# Patient Record
Sex: Male | Born: 1995 | Race: White | Hispanic: No | Marital: Married | Smoking: Former smoker
Health system: Southern US, Community
[De-identification: ages and names within clinical notes are randomized; demographics above are authoritative.]

## PROBLEM LIST (undated history)

## (undated) ENCOUNTER — Ambulatory Visit: Source: Home / Self Care

## (undated) DIAGNOSIS — Z8614 Personal history of Methicillin resistant Staphylococcus aureus infection: Secondary | ICD-10-CM

## (undated) DIAGNOSIS — F419 Anxiety disorder, unspecified: Secondary | ICD-10-CM

## (undated) DIAGNOSIS — T4145XA Adverse effect of unspecified anesthetic, initial encounter: Secondary | ICD-10-CM

## (undated) DIAGNOSIS — Z789 Other specified health status: Secondary | ICD-10-CM

## (undated) HISTORY — DX: Personal history of Methicillin resistant Staphylococcus aureus infection: Z86.14

---

## 2014-02-28 ENCOUNTER — Encounter (HOSPITAL_COMMUNITY): Payer: Self-pay | Admitting: Emergency Medicine

## 2014-02-28 ENCOUNTER — Emergency Department (INDEPENDENT_AMBULATORY_CARE_PROVIDER_SITE_OTHER)
Admission: EM | Admit: 2014-02-28 | Discharge: 2014-02-28 | Disposition: A | Discharge status: Home / Self Care | Payer: BC Managed Care – PPO | Source: Home / Self Care | Attending: Family Medicine | Admitting: Family Medicine

## 2014-02-28 DIAGNOSIS — S0003XA Contusion of scalp, initial encounter: Secondary | ICD-10-CM

## 2014-02-28 DIAGNOSIS — IMO0002 Reserved for concepts with insufficient information to code with codable children: Secondary | ICD-10-CM

## 2014-02-28 DIAGNOSIS — S1093XA Contusion of unspecified part of neck, initial encounter: Secondary | ICD-10-CM

## 2014-02-28 DIAGNOSIS — Y93H2 Activity, gardening and landscaping: Secondary | ICD-10-CM

## 2014-02-28 DIAGNOSIS — S0083XA Contusion of other part of head, initial encounter: Secondary | ICD-10-CM

## 2014-02-28 MED ORDER — CEPHALEXIN 500 MG PO CAPS: 500.0000 mg | ORAL_CAPSULE | Freq: Four times a day (QID) | ORAL | Status: DC
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
## 2014-02-28 NOTE — Discharge Instructions (Signed)
Please apply warm compresses to affected area 4 x day and take medication as prescribed. It appears that you have a small collection of blood under the skin and the overlying skin has developed a small infection. If area does not improve after completing course of cephalexin, please return for re-evaluation.  Hematoma A hematoma is a collection of blood under the skin, in an organ, in a body space, in a joint space, or in other tissue. The blood can clot to form a lump that you can see and feel. The lump is often firm and may sometimes become sore and tender. Most hematomas get better in a few days to weeks. However, some hematomas may be serious and require medical care. Hematomas can range in size from very small to very large. CAUSES  A hematoma can be caused by a blunt or penetrating injury. It can also be caused by spontaneous leakage from a blood vessel under the skin. Spontaneous leakage from a blood vessel is more likely to occur in older people, especially those taking blood thinners. Sometimes, a hematoma can develop after certain medical procedures. SIGNS AND SYMPTOMS   A firm lump on the body.  Possible pain and tenderness in the area.  Bruising.Blue, dark blue, purple-red, or yellowish skin may appear at the site of the hematoma if the hematoma is close to the surface of the skin. For hematomas in deeper tissues or body spaces, the signs and symptoms may be subtle. For example, an intra-abdominal hematoma may cause abdominal pain, weakness, fainting, and shortness of breath. An intracranial hematoma may cause a headache or symptoms such as weakness, trouble speaking, or a change in consciousness. DIAGNOSIS  A hematoma can usually be diagnosed based on your medical history and a physical exam. Imaging tests may be needed if your health care provider suspects a hematoma in deeper tissues or body spaces, such as the abdomen, head, or chest. These tests may include ultrasonography or a CT  scan.  TREATMENT  Hematomas usually go away on their own over time. Rarely does the blood need to be drained out of the body. Large hematomas or those that may affect vital organs will sometimes need surgical drainage or monitoring. HOME CARE INSTRUCTIONS   Apply ice to the injured area:   Put ice in a plastic bag.   Place a towel between your skin and the bag.   Leave the ice on for 20 minutes, 2 3 times a day for the first 1 to 2 days.   After the first 2 days, switch to using warm compresses on the hematoma.   Elevate the injured area to help decrease pain and swelling. Wrapping the area with an elastic bandage may also be helpful. Compression helps to reduce swelling and promotes shrinking of the hematoma. Make sure the bandage is not wrapped too tight.   If your hematoma is on a lower extremity and is painful, crutches may be helpful for a couple days.   Only take over-the-counter or prescription medicines as directed by your health care provider. SEEK IMMEDIATE MEDICAL CARE IF:   You have increasing pain, or your pain is not controlled with medicine.   You have a fever.   You have worsening swelling or discoloration.   Your skin over the hematoma breaks or starts bleeding.   Your hematoma is in your chest or abdomen and you have weakness, shortness of breath, or a change in consciousness.  Your hematoma is on your scalp (caused by a  fall or injury) and you have a worsening headache or a change in alertness or consciousness. MAKE SURE YOU:   Understand these instructions.  Will watch your condition.  Will get help right away if you are not doing well or get worse. Document Released: 05/11/2004 Document Revised: 05/30/2013 Document Reviewed: 03/07/2013 [facility] Patient Information 2014 [place].
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
## 2014-03-04 NOTE — ED Provider Notes (Signed)
Medical screening examination/treatment/procedure(s) were performed by a resident physician or non-physician practitioner and as the supervising physician I was immediately available for consultation/collaboration.  Amoni Scallan, MD    Anikka Marsan S Classie Weng, MD 03/04/14 0852 

## 2014-07-16 ENCOUNTER — Encounter (HOSPITAL_COMMUNITY): Payer: Self-pay | Admitting: Emergency Medicine

## 2014-07-16 ENCOUNTER — Emergency Department (HOSPITAL_COMMUNITY)
Admission: EM | Admit: 2014-07-16 | Discharge: 2014-07-16 | Disposition: A | Discharge status: Home / Self Care | Payer: BC Managed Care – PPO | Attending: Emergency Medicine | Admitting: Emergency Medicine

## 2014-07-16 DIAGNOSIS — L739 Follicular disorder, unspecified: Secondary | ICD-10-CM | POA: Insufficient documentation

## 2014-07-16 DIAGNOSIS — Z880 Allergy status to penicillin: Secondary | ICD-10-CM | POA: Insufficient documentation

## 2014-07-16 DIAGNOSIS — M7989 Other specified soft tissue disorders: Secondary | ICD-10-CM | POA: Diagnosis present

## 2014-07-16 DIAGNOSIS — Z720 Tobacco use: Secondary | ICD-10-CM | POA: Diagnosis not present

## 2014-07-16 DIAGNOSIS — Z792 Long term (current) use of antibiotics: Secondary | ICD-10-CM | POA: Diagnosis not present

## 2014-07-16 NOTE — Discharge Instructions (Signed)
Return to the emergency room with worsening of symptoms, new symptoms or with symptoms that are concerning, especially fevers, nausea, vomiting, increased redness, swelling, warmth and pain.  Applying warm compresses to the affected areas. Please call your doctor for a followup appointment within 24-48 hours. When you talk to your doctor please let them know that you were seen in the emergency department and have them acquire all of your records so that they can discuss the findings with you and formulate a treatment plan to fully care for your new and ongoing problems. If you do not have a primary care provider please call the number below under ED resources to establish care with a provider and follow up.    Emergency Department Resource Guide 1) Find a Doctor and Pay Out of Pocket Although you won't have to find out who is covered by your insurance plan, it is a good idea to ask around and get recommendations. You will then need to call the office and see if the doctor you have chosen will accept you as a new patient and what types of options they offer for patients who are self-pay. Some doctors offer discounts or will set up payment plans for their patients who do not have insurance, but you will need to ask so you aren't surprised when you get to your appointment.  2) Contact Your Local Health Department Not all health departments have doctors that can see patients for sick visits, but many do, so it is worth a call to see if yours does. If you don't know where your local health department is, you can check in your phone book. The CDC also has a tool to help you locate your state's health department, and many state websites also have listings of all of their local health departments.  3) Find a Walk-in Clinic If your illness is not likely to be very severe or complicated, you may want to try a walk in clinic. These are popping up all over the country in pharmacies, drugstores, and shopping  centers. They're usually staffed by nurse practitioners or physician assistants that have been trained to treat common illnesses and complaints. They're usually fairly quick and inexpensive. However, if you have serious medical issues or chronic medical problems, these are probably not your best option.  No Primary Care Doctor: - Call Health Connect at  [phone] - they can help you locate a primary care doctor that  accepts your insurance, provides certain services, etc. - Physician Referral Service- [phone]  Chronic Pain Problems: Organization         Address  Phone   Notes  [facility]  [phone] Patients need to be referred by their primary care doctor.   Medication Assistance: Organization         Address  Phone   Notes  [facility] Medication [facility] [address] [place] [place], [place] [address] [phone] --Must be a resident of [facility] -- Must have NO insurance coverage whatsoever (no Medicaid/ Medicare, etc.) -- The pt. MUST have a primary care doctor that directs their care regularly and follows them in the community   MedAssist  [phone]   [employer]  [phone]    Agencies that provide inexpensive medical care: Organization         Address  Phone   Notes  [facility]  [phone]   [facility]    [phone]  [facility] [place], [place] [address] [phone]   [place]. [address], [place] [phone]   Planned Parenthood    [phone]   [facility]    [phone]   [address], [place] Phone:  [phone], Fax:  [phone] Hours of Operation:  9 am - 6 pm, M-F.  Also accepts Medicaid/Medicare and self-pay.  [facility] [place], [address], [place] Phone: [phone], Fax: [phone]. Hours of Operation:  8:30 am - 5:30 pm, M-F.  Also accepts Medicaid and self-pay.  [facility] [place] [address] [place] Phone: [phone]   [place], [place], [place] [phone], Ext. [phone] Mondays & Thursdays: 7-9 AM.  First 15 patients are seen on a first come, first serve basis.    [place] Providers:  Organization         Address  Phone   Notes  [facility] [address], [place] [phone] Also accepts self-pay patients.  [facility] [address], [employer]  [phone]   [place], [address], [place] [phone]   [facility] [address] [place] [phone]   Lucianne Lei [address], [place]   [phone] Only accepts [place] Access [place] patients after they have their name applied to their card.   Self-Pay (no insurance) in [facility]:  Organization         Address  Phone   Notes  Sickle Cell Patients, [facility] Internal Medicine [place] [phone]   [facility] [place] [phone]   [facility] [place]  [place], [place], [place] [phone]   [facility]/Dr. Osei-Bonsu  [address] [place] or [address], [place] [phone] Phone number for both [place] and [place] locations is the same.  Urgent Medical and [facility] [address] [place] [phone]   [facility] [address] [place] or [address] [phone]   [facility] [address] [place] [phone], phone; [phone], fax Sees patients 1st and 3rd Saturday of every month.  Must not qualify for public or private insurance (i.e.  Medicaid, Medicare, [place] Health Choice, Veterans' Benefits)  Household income should be no more than 200% of the poverty level The clinic cannot treat you if you are pregnant or think you are pregnant  Sexually transmitted diseases are not treated at the clinic.    Dental Care: Organization         Address  Phone  Notes  [facility] Department of [facility] [place] [phone] Accepts children up to age 21 who are enrolled in [place] or [place]; pregnant women with a Medicaid card; and children who have applied for Medicaid or [place] Health Choice, but were declined, whose parents can pay a reduced fee at time of service.  [facility] Department of [facility]  [address], [place] [phone] Accepts children up to age 70 who  are enrolled in Medicaid or [place] Health Choice; pregnant women with a Medicaid card; and children who have applied for Medicaid or [place] Health Choice, but were declined, whose parents can pay a reduced fee at time of service.  [place] Adult Dental Access PROGRAM  [place] [phone] Patients are seen by appointment only. Walk-ins are not accepted. [place] will see patients 59 years of age and older. Monday - Tuesday (8am-5pm) Most Wednesdays (8:30-5pm) $30 per visit, cash only  [facility] Adult Dental Access PROGRAM  [address], [facility] [phone] Patients are seen by appointment only. Walk-ins are not accepted. [place] will see patients 57 years of age and older. One Wednesday Evening (Monthly: Volunteer Based).  $30 per visit, cash only  [place]  [phone] for adults; Children under age 44, call Graduate Pediatric Dentistry at [phone]. Children aged 28-14, please call [phone] to request a pediatric application.  Dental services are provided in all areas of dental care including fillings,  crowns and bridges, complete and partial dentures, implants, gum treatment, root canals, and extractions. Preventive care is also provided. Treatment is provided to both adults and children. Patients are selected via a lottery and there is often a waiting list.   [facility] [address] [place]  [phone] [email or web address]   [facility] [address] [place], [place] [phone], Ext. [phone] Second and Fourth Thursday of each month, opens at 6:30 AM; Clinic ends at 9 AM.  Patients are seen on a first-come first-served basis, and a limited number are seen during each clinic.   [facility]  [address] Hillard Danker [place], [place] [phone]   Eligibility Requirements You must have lived in [place], [place], or [place] counties for at least the last three months.   You cannot be eligible for state or federal sponsored [employer], including [employer], [place], or [employer].   You generally cannot be eligible for healthcare insurance through your employer.    How to apply: Eligibility screenings are held every Tuesday and Wednesday afternoon from 1:00 pm until 4:00 pm. You do not need an appointment for the interview!  [facility] [address] [place], [place]   [place]  [place] Department  [place]  [phone]    Behavioral Health Resources in the Community: Intensive Outpatient Programs Organization         Address  Phone  Notes  [place]. [address] [place], [place] [phone]   [facility] Outpatient [address] [place], [place]   ADS: Alcohol & Drug Svcs [address] [place], [place]   [place] [address],  [employer], [place] or [phone]   Substance Abuse  Resources Organization         Address  Phone  Notes  Alcohol and Drug Services  [phone]   [place]  [phone]   The [place]   Chinita Pester  [phone]   Residential & Outpatient Substance Abuse Program  [phone]   Psychological Services Organization         Address  Phone  Notes  [facility] [place]  [place]  [phone]   [place] [address],  [place] [phone] or [phone]    Mobile Crisis Teams Organization         Address  Phone  Notes  [facility], Mobile Crisis Care Unit  [phone]   [facility]  [address] [place], [place] [phone]   Doristine LocksSharon DeEsch [address] [place] [phone]    Self-Help/Support Groups Organization         Address  Phone             Notes  [facility] of [place] - variety of support groups  [phone] Call for more information  Narcotics Anonymous (NA), Caring [facility] [address], [place]  2 meetings at this location   Statisticianesidential Treatment Programs Organization         Address  Phone  Notes  [facility] [address],    [place]  [phone]   [facility]  [address], [place] [phone], [place], [place] [phone]   [facility] [address], [place] [phone] Admissions: 8am-3pm M-F  [facility] [address]    [place], [place] [phone]   [facility] [address], [place], [place] [phone]   [facility] [address]  [place], [place] [phone]   [facility] - Intensive Outpatient [address], [place], [place] [phone]   [facility] ([facility] Recovery [facility] [address]  [place], [place] [phone] or [phone]   [facility] Services (RTS) [address] [place], [place] [phone] Accepts Medicaid  [facility] [place] [address]  [place] [phone] Substance Abuse/Addiction Treatment   [facility] Behavioral Health Resources Organization         Address  Phone  Notes  [facility]  [phone]) [phone]   Angie FavaJulie Brannon, PhD [address], [address] [place], [place]   [phone](336) [phone] or [phone]   [facility]   [address] [place], [place] [phone](336) [phone]   [facility] [address], [place], [place] [phone]) [phone] Insurance/Medicaid/sponsorship through [facility]  [facility] [address]                                    [place], [place] [phone]) [phone] Therapy/tele-psych/case  [facility] [address]   [place], [place] [phone](336) [phone]    Dr. Lolly MustacheArfeen  [phone]) [phone]   [facility] [place]  [employer] [facility] 1) [address], [place] 2) [address], [place] 3)  [address], [place] [phone]  [phone]   [facility] Child Abuse Hotline [phone](336) [phone] or [phone](336) [phone] (After Hours)       Folliculitis  Folliculitis is redness, soreness, and swelling (inflammation) of the hair follicles. This condition can occur anywhere on the body. People with weakened immune systems, diabetes, or obesity have a greater risk of getting folliculitis. CAUSES  Bacterial infection. This is the most common cause.  Fungal infection.  Viral infection.  Contact with certain chemicals, especially oils and tars. Long-term folliculitis can result from bacteria that live in the nostrils. The bacteria may trigger multiple outbreaks of folliculitis over time. SYMPTOMS Folliculitis most commonly occurs on the scalp, thighs, legs, back, buttocks, and areas where hair is shaved frequently. An early sign of folliculitis is  a small, white or yellow, pus-filled, itchy lesion (pustule). These lesions appear on a red, inflamed  follicle. They are usually less than 0.2 inches (5 mm) wide. When there is an infection of the follicle that goes deeper, it becomes a boil or furuncle. A group of closely packed boils creates a larger lesion (carbuncle). Carbuncles tend to occur in hairy, sweaty areas of the body. DIAGNOSIS  Your caregiver can usually tell what is wrong by doing a physical exam. A sample may be taken from one of the lesions and tested in a lab. This can help determine what is causing your folliculitis. TREATMENT  Treatment may include:  Applying warm compresses to the affected areas.  Taking antibiotic medicines orally or applying them to the skin.  Draining the lesions if they contain a large amount of pus or fluid.  Laser hair removal for cases of long-lasting folliculitis. This helps to prevent regrowth of the hair. HOME CARE INSTRUCTIONS  Apply warm compresses to the affected areas as directed by your caregiver.  If antibiotics are prescribed, take them as directed. Finish them even if you start to feel better.  You may take over-the-counter medicines to relieve itching.  Do not shave irritated skin.  Follow up with your caregiver as directed. SEEK IMMEDIATE MEDICAL CARE IF:   You have increasing redness, swelling, or pain in the affected area.  You have a fever. MAKE SURE YOU:  Understand these instructions.  Will watch your condition.  Will get help right away if you are not doing well or get worse. Document Released: 12/06/2001 Document Revised: 03/28/2012 Document Reviewed: 12/28/2011 [facility] Patient Information 2015 [place], [place]. This information is not intended to replace advice given to you by your health care provider. Make sure you discuss any questions you have with your health care provider.

## 2014-07-16 NOTE — ED Notes (Signed)
Per Pt, pt c/o insect bite to L lower leg last Friday. Since then it has gotten progressively more swollen and red and pt c/o pain that shoots up his leg "every now and then." Pt A&Ox4. 7/10 pain score.

## 2014-07-16 NOTE — ED Provider Notes (Signed)
CSN: [id number]     Arrival date & time 07/16/14  1605 History  This chart was scribed for non-physician practitioner, Oswaldo ConroyVictoria Jax Abdelrahman PA-C, working with Lyanne CoKevin M Campos, MD by Milly JakobJohn Lee Graves, ED Scribe. The patient was seen in room WTR7/WTR7. Patient's care was started at 6:55 PM.   Chief Complaint  Patient presents with  . Insect Bite   HPI HPI Comments: Randy GreenspanJoseph Mungin is a 18 y.o. male who presents to the Emergency Department complaining of an insect bite which he suspects was a spider 4 days ago. He reports soreness, redness, and swelling at the site. He denies fever, chills, nausea, vomiting, numbness or weakness. He reports taking Ibuprofen with minimal relief. He denies any weakness. He denies having a PCP.     History reviewed. No pertinent past medical history. History reviewed. No pertinent past surgical history. No family history on file. History  Substance Use Topics  . Smoking status: Current Every Day Smoker  . Smokeless tobacco: Not on file  . Alcohol Use: Yes    Review of Systems  Constitutional: Negative for fever and chills.  Gastrointestinal: Negative for nausea and vomiting.  Skin: Positive for wound.      Allergies  Penicillins  Home Medications   Prior to Admission medications   Medication Sig Start Date End Date Taking? Authorizing Provider  cephALEXin (KEFLEX) 500 MG capsule Take 1 capsule (500 mg total) by mouth 4 (four) times daily. X 7 days 02/28/14   Jess BartersJennifer Lee H Presson, PA   BP 116/75  Pulse 76  Temp(Src) 98.2 F (36.8 C) (Oral)  Resp 16  SpO2 100% Physical Exam  Nursing note and vitals reviewed. Constitutional: He appears well-developed and well-nourished. No distress.  HENT:  Head: Normocephalic and atraumatic.  Eyes: Conjunctivae are normal. Right eye exhibits no discharge. Left eye exhibits no discharge.  Pulmonary/Chest: Effort normal. No respiratory distress.  Neurological: He is alert. Coordination normal.  2+ equal distal  pulses. Neurovascularly intact lower leg.  Skin: He is not diaphoretic.  <1cm fluctuant abscess with surrounding erythema to left anterior lower leg. Lesion is tender. No spontaneous drainage.  Psychiatric: He has a normal mood and affect. His behavior is normal.    ED Course  Procedures (including critical care time) Labs Review Labs Reviewed - No data to display  Imaging Review No results found.   EKG Interpretation None      INCISION AND DRAINAGE Performed by: Louann SjogrenVictoria L Braydyn Schultes Consent: Verbal consent obtained. Risks and benefits: risks, benefits and alternatives were discussed Type: abscess  Body area: left lower anterior leg  Anesthesia: none  Incision was made with a 18g needle  Local anesthetic: none  Drainage: purulent  Irrigated with NS  Drainage amount: 1cc  Packing material: none  Patient tolerance: Patient tolerated the procedure well with no immediate complications.      MDM   Final diagnoses:  Folliculitis   Patient with folliculitis amenable to incision and drainage with needle.  Abscess was not large enough to warrant packing or drain.  Encouraged home warm soaks and flushing.  Mild signs of cellulitis is surrounding skin.  Will d/c to home.  No antibiotic therapy is indicated.  Discussed return precautions with patient. Discussed all results and patient verbalizes understanding and agrees with plan.      Louann SjogrenVictoria L Melissaann Dizdarevic, PA-C 07/16/14 1900

## 2014-07-17 NOTE — ED Provider Notes (Addendum)
Medical screening examination/treatment/procedure(s) were conducted as a shared visit with non-physician practitioner(s) and myself.  I personally evaluated the patient during the encounter.  Small superficial abscess. Simple needle drainage today without much obtained. Overall well appearing. Dc home in good condition with warm compresses. No surrounding cellulitis    EKG Interpretation None        Lyanne CoKevin M Kyarra Vancamp, MD 07/17/14 0101  Lyanne CoKevin M Corrie Brannen, MD 07/31/14 [phone]

## 2018-07-08 ENCOUNTER — Emergency Department (HOSPITAL_COMMUNITY): Payer: Medicaid Other

## 2018-07-08 ENCOUNTER — Encounter (HOSPITAL_COMMUNITY): Payer: Self-pay | Admitting: Emergency Medicine

## 2018-07-08 ENCOUNTER — Emergency Department (HOSPITAL_COMMUNITY)
Admission: EM | Admit: 2018-07-08 | Discharge: 2018-07-09 | Disposition: A | Discharge status: Home / Self Care | Payer: Medicaid Other | Attending: Emergency Medicine | Admitting: Emergency Medicine

## 2018-07-08 ENCOUNTER — Other Ambulatory Visit: Payer: Self-pay

## 2018-07-08 DIAGNOSIS — S7012XA Contusion of left thigh, initial encounter: Secondary | ICD-10-CM | POA: Insufficient documentation

## 2018-07-08 DIAGNOSIS — Y999 Unspecified external cause status: Secondary | ICD-10-CM | POA: Diagnosis not present

## 2018-07-08 DIAGNOSIS — S92902A Unspecified fracture of left foot, initial encounter for closed fracture: Secondary | ICD-10-CM | POA: Insufficient documentation

## 2018-07-08 DIAGNOSIS — W11XXXA Fall on and from ladder, initial encounter: Secondary | ICD-10-CM | POA: Diagnosis not present

## 2018-07-08 DIAGNOSIS — Y9289 Other specified places as the place of occurrence of the external cause: Secondary | ICD-10-CM | POA: Diagnosis not present

## 2018-07-08 DIAGNOSIS — W19XXXA Unspecified fall, initial encounter: Secondary | ICD-10-CM

## 2018-07-08 DIAGNOSIS — Y9389 Activity, other specified: Secondary | ICD-10-CM | POA: Insufficient documentation

## 2018-07-08 DIAGNOSIS — Q7292 Unspecified reduction defect of left lower limb: Secondary | ICD-10-CM

## 2018-07-08 DIAGNOSIS — F17210 Nicotine dependence, cigarettes, uncomplicated: Secondary | ICD-10-CM | POA: Insufficient documentation

## 2018-07-08 DIAGNOSIS — T8859XA Other complications of anesthesia, initial encounter: Secondary | ICD-10-CM

## 2018-07-08 DIAGNOSIS — S99922A Unspecified injury of left foot, initial encounter: Secondary | ICD-10-CM | POA: Diagnosis present

## 2018-07-08 HISTORY — DX: Other complications of anesthesia, initial encounter: T88.59XA

## 2018-07-08 LAB — CBC
HCT: 46.1 % (ref 39.0–52.0)
HEMOGLOBIN: 15.1 g/dL (ref 13.0–17.0)
MCH: 29.4 pg (ref 26.0–34.0)
MCHC: 32.8 g/dL (ref 30.0–36.0)
MCV: 89.9 fL (ref 78.0–100.0)
Platelets: 249 10*3/uL (ref 150–400)
RBC: 5.13 MIL/uL (ref 4.22–5.81)
RDW: 12.3 % (ref 11.5–15.5)
WBC: 10.6 10*3/uL — AB (ref 4.0–10.5)

## 2018-07-08 LAB — COMPREHENSIVE METABOLIC PANEL
ALK PHOS: 64 U/L (ref 38–126)
ALT: 36 U/L (ref 0–44)
ANION GAP: 8 (ref 5–15)
AST: 41 U/L (ref 15–41)
Albumin: 4.7 g/dL (ref 3.5–5.0)
BILIRUBIN TOTAL: 1.4 mg/dL — AB (ref 0.3–1.2)
BUN: 17 mg/dL (ref 6–20)
CO2: 22 mmol/L (ref 22–32)
CREATININE: 1.17 mg/dL (ref 0.61–1.24)
Calcium: 10.1 mg/dL (ref 8.9–10.3)
Chloride: 107 mmol/L (ref 98–111)
GFR calc Af Amer: >60 mL/min (ref >60)
GFR calc non Af Amer: >60 mL/min (ref >60)
Glucose, Bld: 107 mg/dL — ABNORMAL HIGH (ref 70–99)
Potassium: 3.7 mmol/L (ref 3.5–5.1)
Sodium: 137 mmol/L (ref 135–145)
TOTAL PROTEIN: 7.8 g/dL (ref 6.5–8.1)

## 2018-07-08 LAB — I-STAT CHEM 8, ED
BUN: 20 mg/dL (ref 6–20)
CHLORIDE: 107 mmol/L (ref 98–111)
Calcium, Ion: 1.23 mmol/L (ref 1.15–1.40)
Creatinine, Ser: 1.1 mg/dL (ref 0.61–1.24)
GLUCOSE: 103 mg/dL — AB (ref 70–99)
HCT: 46 % (ref 39.0–52.0)
Hemoglobin: 15.6 g/dL (ref 13.0–17.0)
POTASSIUM: 3.6 mmol/L (ref 3.5–5.1)
Sodium: 141 mmol/L (ref 135–145)
TCO2: 23 mmol/L (ref 22–32)

## 2018-07-08 LAB — PROTIME-INR
INR: 1
Prothrombin Time: 13.1 seconds (ref 11.4–15.2)

## 2018-07-08 LAB — I-STAT CG4 LACTIC ACID, ED: Lactic Acid, Venous: 1.71 mmol/L (ref 0.5–1.9)

## 2018-07-08 LAB — CDS SEROLOGY

## 2018-07-08 MED ORDER — FENTANYL CITRATE (PF) 100 MCG/2ML IJ SOLN
50.0000 ug | Freq: Once | INTRAMUSCULAR | Status: DC
  Filled 2018-07-08: qty 2

## 2018-07-08 MED ORDER — LIDOCAINE HCL (PF) 1 % IJ SOLN: 5.0000 mL | Freq: Once | INTRAMUSCULAR | Status: DC

## 2018-07-08 MED ORDER — PROPOFOL 10 MG/ML IV BOLUS
INTRAVENOUS | Status: AC | PRN
  Administered 2018-07-08: 20 mg via INTRAVENOUS
  Administered 2018-07-08: 60 mg via INTRAVENOUS
  Administered 2018-07-08: 20 mg via INTRAVENOUS
  Administered 2018-07-08: 40 mg via INTRAVENOUS
  Administered 2018-07-08 (×2): 60 mg via INTRAVENOUS
  Administered 2018-07-08: 40 mg via INTRAVENOUS
  Administered 2018-07-08: 20 mg via INTRAVENOUS

## 2018-07-08 MED ORDER — MORPHINE SULFATE (PF) 4 MG/ML IV SOLN
6.0000 mg | Freq: Once | INTRAVENOUS | Status: AC
  Administered 2018-07-08: 6 mg via INTRAVENOUS
  Filled 2018-07-08: qty 2

## 2018-07-08 MED ORDER — PROPOFOL 10 MG/ML IV BOLUS
INTRAVENOUS | Status: AC | PRN
  Administered 2018-07-08: 60 mg via INTRAVENOUS

## 2018-07-08 MED ORDER — PROPOFOL 1000 MG/100ML IV EMUL
INTRAVENOUS | Status: AC
  Filled 2018-07-08: qty 100

## 2018-07-08 MED ORDER — FENTANYL CITRATE (PF) 100 MCG/2ML IJ SOLN
INTRAMUSCULAR | Status: AC | PRN
  Administered 2018-07-08: 50 ug via INTRAVENOUS

## 2018-07-08 MED ORDER — HYDROCODONE-ACETAMINOPHEN 5-325 MG PO TABS: 1.0000 | ORAL_TABLET | ORAL | 0 refills | Status: DC | PRN

## 2018-07-08 MED ORDER — PROPOFOL 10 MG/ML IV BOLUS
100.0000 mg | Freq: Once | INTRAVENOUS | Status: DC
  Filled 2018-07-08: qty 20

## 2018-07-08 MED ORDER — FENTANYL CITRATE (PF) 100 MCG/2ML IJ SOLN
50.0000 ug | Freq: Once | INTRAMUSCULAR | Status: AC
  Administered 2018-07-08: 50 ug via INTRAVENOUS
  Filled 2018-07-08: qty 2

## 2018-07-08 MED ORDER — ONDANSETRON HCL 4 MG/2ML IJ SOLN
4.0000 mg | Freq: Once | INTRAMUSCULAR | Status: AC
  Administered 2018-07-08: 4 mg via INTRAVENOUS
  Filled 2018-07-08: qty 2

## 2018-07-08 MED ORDER — LACTATED RINGERS IV BOLUS: 1000.0000 mL | Freq: Once | INTRAVENOUS | Status: DC

## 2018-07-08 MED ORDER — MORPHINE SULFATE (PF) 4 MG/ML IV SOLN
4.0000 mg | Freq: Once | INTRAVENOUS | Status: AC
  Administered 2018-07-08: 4 mg via INTRAVENOUS
  Filled 2018-07-08: qty 1

## 2018-07-08 MED ORDER — SODIUM CHLORIDE 0.9 % IV BOLUS
20.0000 mL/kg | Freq: Once | INTRAVENOUS | Status: AC
  Administered 2018-07-08: 1814 mL via INTRAVENOUS

## 2018-07-08 NOTE — ED Notes (Signed)
Patient transported to CT 

## 2018-07-08 NOTE — ED Notes (Signed)
MD in triage to assess patient.

## 2018-07-08 NOTE — Progress Notes (Signed)
Orthopedic Tech Progress Note Patient Details:  Randy Mcintyre August 19, 1996 [id number]  Patient ID: Randy Mcintyre, male   DOB: September 04, 1996, 22 y.o.   MRN: [id number]   [name] 07/08/2018, 5:21 PM Made level 2 trauma visit

## 2018-07-08 NOTE — ED Triage Notes (Signed)
Patient to ED for L lower leg/ankle pain after falling from a deer stand approximately 82ft. off the ground. Patient denies hitting head or LOC. Patient in apparent distress, vomited x 1 in triage d/t pain. Denies dizziness, headache, neck pain. A&O x 4.

## 2018-07-08 NOTE — ED Provider Notes (Addendum)
[facility] EMERGENCY DEPARTMENT Provider Note   CSN: [id number] Arrival date & time: 07/08/18  1623     History   Chief Complaint Chief Complaint  Patient presents with  . Fall    HPI Randy Mcintyre is a 22 y.o. male.  HPI  22 year old male with no pertinent past medical he presents status post fall from deer stand roughly 20 feet height.  Patient said he was premature he stand when he fell on the ground.  Patient denies hitting head, denies LOC, denies nausea or vomiting.  Patient had immediate pain to left lower extremity.  Patient without any complaint.  Patient brother helped him out of the woods where he called EMS who brought him to [facility] emergency department for further evaluation.  History reviewed. No pertinent past medical history.  There are no active problems to display for this patient.   History reviewed. No pertinent surgical history.      Home Medications    Prior to Admission medications   Medication Sig Start Date End Date Taking? Authorizing Provider  ibuprofen (ADVIL,MOTRIN) 200 MG tablet Take 600 mg by mouth every 6 (six) hours as needed for headache (pain).   Yes [provider]  HYDROcodone-acetaminophen (NORCO/VICODIN) 5-325 MG tablet Take 1 tablet by mouth every 4 (four) hours as needed for up to 24 doses. 07/08/18   Margit Banda, MD    Family History No family history on file.  Social History Social History   Tobacco Use  . Smoking status: Current Every Day Smoker  Substance Use Topics  . Alcohol use: Yes  . Drug use: Not on file     Allergies   Penicillins   Review of Systems Review of Systems  Constitutional: Negative for chills and fever.  HENT: Negative for ear pain and sore throat.   Eyes: Negative for pain and visual disturbance.  Respiratory: Negative for cough and shortness of breath.   Cardiovascular: Negative for chest pain and palpitations.  Gastrointestinal: Negative for abdominal  pain and vomiting.  Genitourinary: Negative for dysuria and hematuria.  Musculoskeletal: Positive for arthralgias. Negative for back pain, neck pain and neck stiffness.  Skin: Positive for wound. Negative for color change and rash.  Neurological: Negative for seizures and syncope.  All other systems reviewed and are negative.    Physical Exam Updated Vital Signs BP 132/82   Pulse 96   Temp 98.4 F (36.9 C) (Oral)   Resp 18   Ht 5' 9.5" (1.765 m)   Wt 90.7 kg   SpO2 100%   BMI 29.11 kg/m   Physical Exam  Constitutional: He appears well-developed and well-nourished.  HENT:  Head: Normocephalic and atraumatic.  Midface stable  Eyes: Conjunctivae are normal.  Neck: Neck supple.  Cardiovascular: Normal rate and regular rhythm.  No murmur heard. Pulmonary/Chest: Effort normal and breath sounds normal. No respiratory distress.  Chest stable to anterior lateral compression  Abdominal: Soft. There is no tenderness.  Musculoskeletal: He exhibits no edema.       Legs: Patient neurovascular intact in left lower extremity.  Neurological: He is alert.  Skin: Skin is warm and dry.  Psychiatric: He has a normal mood and affect.  Nursing note and vitals reviewed.    ED Treatments / Results  Labs (all labs ordered are listed, but only abnormal results are displayed) Labs Reviewed  COMPREHENSIVE METABOLIC PANEL - Abnormal; Notable for the following components:      Result Value   Glucose, Bld  107 (*)    Total Bilirubin 1.4 (*)    All other components within normal limits  CBC - Abnormal; Notable for the following components:   WBC 10.6 (*)    All other components within normal limits  I-STAT CHEM 8, ED - Abnormal; Notable for the following components:   Glucose, Bld 103 (*)    All other components within normal limits  CDS SEROLOGY  PROTIME-INR  ETHANOL  URINALYSIS, ROUTINE W REFLEX MICROSCOPIC  I-STAT CG4 LACTIC ACID, ED  SAMPLE TO BLOOD BANK     EKG None  Radiology Dg Tibia/fibula Left  Result Date: 07/08/2018 CLINICAL DATA:  Pain after fall EXAM: LEFT TIBIA AND FIBULA - 2 VIEW COMPARISON:  None. FINDINGS: There is no evidence of fracture or other focal bone lesions. Soft tissues are unremarkable. IMPRESSION: Negative. Electronically Signed   By: Gerome Sam III M.D   On: 07/08/2018 18:38   Dg Ankle Complete Left  Result Date: 07/08/2018 CLINICAL DATA:  Pain after fall EXAM: LEFT ANKLE COMPLETE - 3+ VIEW COMPARISON:  None. FINDINGS: There is no evidence of fracture, dislocation, or joint effusion. There is no evidence of arthropathy or other focal bone abnormality. Soft tissues are unremarkable. IMPRESSION: Negative. Electronically Signed   By: Gerome Sam III M.D   On: 07/08/2018 18:40   Ct Head Wo Contrast  Result Date: 07/08/2018 CLINICAL DATA:  Fall from tree stand EXAM: CT HEAD WITHOUT CONTRAST CT CERVICAL SPINE WITHOUT CONTRAST TECHNIQUE: Multidetector CT imaging of the head and cervical spine was performed following the standard protocol without intravenous contrast. Multiplanar CT image reconstructions of the cervical spine were also generated. COMPARISON:  None. FINDINGS: CT HEAD FINDINGS Brain: There is no mass, hemorrhage or extra-axial collection. The size and configuration of the ventricles and extra-axial CSF spaces are normal. There is no acute or chronic infarction. The brain parenchyma is normal. Vascular: No abnormal hyperdensity of the major intracranial arteries or dural venous sinuses. No intracranial atherosclerosis. Skull: The visualized skull base, calvarium and extracranial soft tissues are normal. Sinuses/Orbits: No fluid levels or advanced mucosal thickening of the visualized paranasal sinuses. No mastoid or middle ear effusion. The orbits are normal. CT CERVICAL SPINE FINDINGS Alignment: No static subluxation. Facets are aligned. Occipital condyles are normally positioned. Skull base and vertebrae: No  acute fracture. Soft tissues and spinal canal: No prevertebral fluid or swelling. No visible canal hematoma. Disc levels: No advanced spinal canal or neural foraminal stenosis. Upper chest: No pneumothorax, pulmonary nodule or pleural effusion. Other: Normal visualized paraspinal cervical soft tissues. IMPRESSION: Normal CT of the head and cervical spine. Electronically Signed   By: Deatra Robinson M.D.   On: 07/08/2018 19:13   Ct Cervical Spine Wo Contrast  Result Date: 07/08/2018 CLINICAL DATA:  Fall from tree stand EXAM: CT HEAD WITHOUT CONTRAST CT CERVICAL SPINE WITHOUT CONTRAST TECHNIQUE: Multidetector CT imaging of the head and cervical spine was performed following the standard protocol without intravenous contrast. Multiplanar CT image reconstructions of the cervical spine were also generated. COMPARISON:  None. FINDINGS: CT HEAD FINDINGS Brain: There is no mass, hemorrhage or extra-axial collection. The size and configuration of the ventricles and extra-axial CSF spaces are normal. There is no acute or chronic infarction. The brain parenchyma is normal. Vascular: No abnormal hyperdensity of the major intracranial arteries or dural venous sinuses. No intracranial atherosclerosis. Skull: The visualized skull base, calvarium and extracranial soft tissues are normal. Sinuses/Orbits: No fluid levels or advanced mucosal thickening of the visualized  paranasal sinuses. No mastoid or middle ear effusion. The orbits are normal. CT CERVICAL SPINE FINDINGS Alignment: No static subluxation. Facets are aligned. Occipital condyles are normally positioned. Skull base and vertebrae: No acute fracture. Soft tissues and spinal canal: No prevertebral fluid or swelling. No visible canal hematoma. Disc levels: No advanced spinal canal or neural foraminal stenosis. Upper chest: No pneumothorax, pulmonary nodule or pleural effusion. Other: Normal visualized paraspinal cervical soft tissues. IMPRESSION: Normal CT of the head and  cervical spine. Electronically Signed   By: Deatra Robinson M.D.   On: 07/08/2018 19:13   Dg Pelvis Portable  Result Date: 07/08/2018 CLINICAL DATA:  Fall. EXAM: PORTABLE PELVIS 1-2 VIEWS COMPARISON:  None. FINDINGS: No definite evidence of fracture is noted. However, mild widening of the left sacroiliac joint is noted. Hip joints are unremarkable. IMPRESSION: Mild widening of left sacroiliac joint is noted. CT scan of the pelvis is recommended to evaluate for possible occult fracture and other abnormality. Electronically Signed   By: Lupita Raider, M.D.   On: 07/08/2018 17:51   Dg Chest Port 1 View  Result Date: 07/08/2018 CLINICAL DATA:  Fall. EXAM: PORTABLE CHEST 1 VIEW COMPARISON:  None. FINDINGS: The heart size and mediastinal contours are within normal limits. Both lungs are clear. No pneumothorax or pleural effusion is noted. The visualized skeletal structures are unremarkable. IMPRESSION: No acute cardiopulmonary abnormality seen. Electronically Signed   By: Lupita Raider, M.D.   On: 07/08/2018 17:49   Dg Shoulder Left  Result Date: 07/08/2018 CLINICAL DATA:  Pain after fall EXAM: LEFT SHOULDER - 2+ VIEW COMPARISON:  None. FINDINGS: There is no evidence of fracture or dislocation. There is no evidence of arthropathy or other focal bone abnormality. Soft tissues are unremarkable. IMPRESSION: Negative. Electronically Signed   By: Gerome Sam III M.D   On: 07/08/2018 18:45   Dg Foot Complete Left  Result Date: 07/08/2018 CLINICAL DATA:  22 year old male with reduction of the left foot fractures. EXAM: LEFT FOOT - COMPLETE 3+ VIEW COMPARISON:  Earlier CT dated 07/08/2018 FINDINGS: Mildly displaced fractures of the distal second-fifth metatarsals. Overall decreased displacement of the fractures since the prior CT. There is persistent plantar displacement of a metatarsal, likely the fifth metatarsal. Mildly displaced fracture of the lateral aspect of the cuboid. No dislocation. There is  diffuse soft tissue swelling of the foot. IMPRESSION: Overall reduction of the displacement of the distal metatarsal fractures with persistent plantar displacement of one of the metatarsals. Minimally displaced cuboid fracture. Electronically Signed   By: Elgie Collard M.D.   On: 07/08/2018 23:02   Dg Foot Complete Left  Result Date: 07/08/2018 CLINICAL DATA:  Pain after fall EXAM: LEFT FOOT - COMPLETE 3+ VIEW COMPARISON:  None. FINDINGS: Fractures are seen through the distal second, third, fourth, and fifth metatarsals with lateral displacement of the metatarsal heads. The remainder of the metatarsals are grossly intact. No definitive Lisfranc injury. The toes are intact. There appears to be a bony fragment adjacent to the cuboid. It least 2 metatarsals are displaced distally towards the plantar surface of the foot, seen on the lateral view. IMPRESSION: 1. Displaced fractures through the distal second, third, fourth, and fifth metatarsals. The distal aspects of 2 metatarsals are displaced towards the plantar surface of the foot. While there is no definitive Lisfranc injury on this study, the lateral view is concerning. There is also an apparent cuboid fracture. Recommend a CT scan of the foot for better evaluation. Electronically Signed  By: Gerome Sam III M.D   On: 07/08/2018 18:44    Procedures .Sedation Date/Time: 07/09/2018 12:01 AM Performed by: Margit Banda, MD Authorized by: Margit Banda, MD   Consent:    Consent obtained:  Written   Consent given by:  Patient   Risks discussed:  Allergic reaction, dysrhythmia, inadequate sedation, nausea, prolonged hypoxia resulting in organ damage, prolonged sedation necessitating reversal, respiratory compromise necessitating ventilatory assistance and intubation and vomiting   Alternatives discussed:  Analgesia without sedation and regional anesthesia Universal protocol:    Procedure explained and questions answered to patient or proxy's  satisfaction: yes     Relevant documents present and verified: yes     Test results available and properly labeled: yes     Imaging studies available: yes     Required blood products, implants, devices, and special equipment available: yes     Site/side marked: yes     Immediately prior to procedure a time out was called: yes   Indications:    Procedure performed:  Fracture reduction   Procedure necessitating sedation performed by:  Different physician   Intended level of sedation:  Moderate (conscious sedation) Pre-sedation assessment:    Time since last food or drink:  Unable to specify   NPO status caution: unable to specify NPO status     ASA classification: class 1 - normal, healthy patient     Neck mobility: normal     Mouth opening:  3 or more finger widths   Thyromental distance:  2 finger widths   Mallampati score:  I - soft palate, uvula, fauces, pillars visible   Pre-sedation assessments completed and reviewed: airway patency, cardiovascular function, hydration status, mental status, nausea/vomiting, pain level, respiratory function and temperature     Pre-sedation assessment completed:  07/09/2018 9:15 PM Immediate pre-procedure details:    Reassessment: Patient reassessed immediately prior to procedure     Reviewed: vital signs, relevant labs/tests and NPO status     Verified: bag valve mask available, emergency equipment available, intubation equipment available, IV patency confirmed, oxygen available, reversal medications available and suction available   Procedure details (see MAR for exact dosages):    Preoxygenation:  Nasal cannula   Sedation:  Propofol   Analgesia:  Fentanyl   Intra-procedure monitoring:  Blood pressure monitoring, cardiac monitor, continuous capnometry, continuous pulse oximetry, frequent LOC assessments and frequent vital sign checks   Intra-procedure events: none     Total Provider sedation time (minutes):  49 Post-procedure details:     Post-sedation assessment completed:  07/08/2018 11:45 PM   Attendance: Constant attendance by certified staff until patient recovered     Recovery: Patient returned to pre-procedure baseline     Post-sedation assessments completed and reviewed: airway patency, cardiovascular function, hydration status, mental status, nausea/vomiting, pain level, respiratory function and temperature     Patient is stable for discharge or admission: yes     Patient tolerance:  Tolerated well, no immediate complications   (including critical care time)  Medications Ordered in ED Medications  propofol (DIPRIVAN) 10 mg/mL bolus/IV push 100 mg (100 mg Intravenous See Procedure Record 07/08/18 2237)  lactated ringers bolus 1,000 mL (has no administration in time range)  fentaNYL (SUBLIMAZE) injection 50 mcg (50 mcg Intravenous See Procedure Record 07/08/18 2236)  propofol (DIPRIVAN) 1000 MG/100ML infusion (has no administration in time range)  sodium chloride 0.9 % bolus 1,814 mL (0 mL/kg  90.7 kg Intravenous Stopped 07/08/18 1928)  morphine 4 MG/ML injection 4 mg (4  mg Intravenous Given 07/08/18 1727)  fentaNYL (SUBLIMAZE) injection 50 mcg (50 mcg Intravenous Given 07/08/18 1801)  morphine 4 MG/ML injection 6 mg (6 mg Intravenous Given 07/08/18 1930)  ondansetron (ZOFRAN) injection 4 mg (4 mg Intravenous Given 07/08/18 2137)  propofol (DIPRIVAN) 10 mg/mL bolus/IV push (60 mg Intravenous Given 07/08/18 2157)  fentaNYL (SUBLIMAZE) injection (50 mcg Intravenous Given 07/08/18 2156)  propofol (DIPRIVAN) 10 mg/mL bolus/IV push (60 mg Intravenous Given 07/08/18 2211)     Initial Impression / Assessment and Plan / ED Course  I have reviewed the triage vital signs and the nursing notes.  Pertinent labs & imaging results that were available during my care of the patient were reviewed by me and considered in my medical decision making (see chart for details).     22 year old male with no pertinent past medical he presents  status post fall from deer stand roughly 20 feet height.  History as above.  ABCs intact.  Secondary exam revealed findings and tenderness as above.  Appropriate imaging performed.  CT head neck negative for acute injury.  Left lower extremity x-rays reveal fractures of second through fifth metatarsal bones.  CT left lower extremity performed.  No obvious Lisfranc fracture.  Orthopedic surgery consulted, Dr. Renaye Rakers, who recommend fracture reduction and splinting.  Patient sedated and splint as above.  Patient to follow-up with orthopedic surgery on Monday, 2 days, for follow-up and surgical planning.  Patient given 4 days of p.o. narcotic pain medication.  [place] opioid database checked with no significant findings.     Final Clinical Impressions(s) / ED Diagnoses   Final diagnoses:  Reduction defect of left lower extremity  Fall, initial encounter  Closed fracture of left foot, initial encounter    ED Discharge Orders         Ordered    HYDROcodone-acetaminophen (NORCO/VICODIN) 5-325 MG tablet  Every 4 hours PRN     07/08/18 2353           Margit Banda, MD 07/09/18 0001    Margit Banda, MD 07/09/18 Pernell Dupre    Blane Ohara, MD 07/09/18 [phone]

## 2018-07-08 NOTE — ED Notes (Signed)
Patient transported to X-ray 

## 2018-07-08 NOTE — ED Notes (Signed)
Pt and mother signed consent for procedure and sedation.

## 2018-07-08 NOTE — H&P (Signed)
I have examine the patient and taken a complete history. Detailed consult to follow. His foot compartments are soft. He is now vascular Lee intact. Does your woman well perfused. I performed a close reduction of his metatarsal fractures. He was neurovascular Lee intact after this. I discussed his options I offered admission to the hospital he has elected to be discharged home. I warned about the risks as well as signs and symptoms of co mpartment syndrome should he have an increase in pain he will return to the emergency room immediately.

## 2018-07-11 ENCOUNTER — Other Ambulatory Visit: Payer: Self-pay | Admitting: Orthopaedic Surgery

## 2018-07-13 ENCOUNTER — Encounter (HOSPITAL_COMMUNITY): Payer: Self-pay | Admitting: *Deleted

## 2018-07-13 ENCOUNTER — Other Ambulatory Visit: Payer: Self-pay

## 2018-07-16 ENCOUNTER — Encounter (HOSPITAL_COMMUNITY): Payer: Self-pay

## 2018-07-16 ENCOUNTER — Inpatient Hospital Stay (HOSPITAL_COMMUNITY): Payer: Medicaid Other

## 2018-07-16 ENCOUNTER — Emergency Department (HOSPITAL_COMMUNITY): Payer: Medicaid Other

## 2018-07-16 ENCOUNTER — Inpatient Hospital Stay (HOSPITAL_COMMUNITY)
Admission: EM | Admit: 2018-07-16 | Discharge: 2018-07-27 | DRG: 907 | Disposition: A | Discharge status: Home / Self Care | Payer: Medicaid Other | Attending: Internal Medicine | Admitting: Internal Medicine

## 2018-07-16 DIAGNOSIS — E876 Hypokalemia: Secondary | ICD-10-CM | POA: Diagnosis present

## 2018-07-16 DIAGNOSIS — S93325A Dislocation of tarsometatarsal joint of left foot, initial encounter: Secondary | ICD-10-CM | POA: Diagnosis present

## 2018-07-16 DIAGNOSIS — Z880 Allergy status to penicillin: Secondary | ICD-10-CM

## 2018-07-16 DIAGNOSIS — W1789XA Other fall from one level to another, initial encounter: Secondary | ICD-10-CM | POA: Diagnosis present

## 2018-07-16 DIAGNOSIS — J9690 Respiratory failure, unspecified, unspecified whether with hypoxia or hypercapnia: Secondary | ICD-10-CM

## 2018-07-16 DIAGNOSIS — L89816 Pressure-induced deep tissue damage of head: Secondary | ICD-10-CM | POA: Diagnosis present

## 2018-07-16 DIAGNOSIS — J9811 Atelectasis: Secondary | ICD-10-CM | POA: Diagnosis present

## 2018-07-16 DIAGNOSIS — R4189 Other symptoms and signs involving cognitive functions and awareness: Secondary | ICD-10-CM

## 2018-07-16 DIAGNOSIS — Z9289 Personal history of other medical treatment: Secondary | ICD-10-CM

## 2018-07-16 DIAGNOSIS — I429 Cardiomyopathy, unspecified: Secondary | ICD-10-CM | POA: Diagnosis present

## 2018-07-16 DIAGNOSIS — S92212A Displaced fracture of cuboid bone of left foot, initial encounter for closed fracture: Secondary | ICD-10-CM | POA: Diagnosis present

## 2018-07-16 DIAGNOSIS — I272 Pulmonary hypertension, unspecified: Secondary | ICD-10-CM | POA: Diagnosis present

## 2018-07-16 DIAGNOSIS — R7401 Elevation of levels of liver transaminase levels: Secondary | ICD-10-CM

## 2018-07-16 DIAGNOSIS — L8990 Pressure ulcer of unspecified site, unspecified stage: Secondary | ICD-10-CM

## 2018-07-16 DIAGNOSIS — E872 Acidosis: Secondary | ICD-10-CM | POA: Diagnosis present

## 2018-07-16 DIAGNOSIS — F17200 Nicotine dependence, unspecified, uncomplicated: Secondary | ICD-10-CM | POA: Diagnosis present

## 2018-07-16 DIAGNOSIS — E860 Dehydration: Secondary | ICD-10-CM | POA: Diagnosis present

## 2018-07-16 DIAGNOSIS — D696 Thrombocytopenia, unspecified: Secondary | ICD-10-CM | POA: Diagnosis present

## 2018-07-16 DIAGNOSIS — N179 Acute kidney failure, unspecified: Secondary | ICD-10-CM | POA: Diagnosis present

## 2018-07-16 DIAGNOSIS — Z09 Encounter for follow-up examination after completed treatment for conditions other than malignant neoplasm: Secondary | ICD-10-CM

## 2018-07-16 DIAGNOSIS — T50901A Poisoning by unspecified drugs, medicaments and biological substances, accidental (unintentional), initial encounter: Secondary | ICD-10-CM | POA: Diagnosis present

## 2018-07-16 DIAGNOSIS — J15212 Pneumonia due to Methicillin resistant Staphylococcus aureus: Secondary | ICD-10-CM | POA: Diagnosis present

## 2018-07-16 DIAGNOSIS — G9340 Encephalopathy, unspecified: Secondary | ICD-10-CM

## 2018-07-16 DIAGNOSIS — K7200 Acute and subacute hepatic failure without coma: Secondary | ICD-10-CM | POA: Diagnosis present

## 2018-07-16 DIAGNOSIS — R578 Other shock: Secondary | ICD-10-CM | POA: Diagnosis present

## 2018-07-16 DIAGNOSIS — E875 Hyperkalemia: Secondary | ICD-10-CM | POA: Diagnosis present

## 2018-07-16 DIAGNOSIS — S92222A Displaced fracture of lateral cuneiform of left foot, initial encounter for closed fracture: Secondary | ICD-10-CM | POA: Diagnosis present

## 2018-07-16 DIAGNOSIS — S92332A Displaced fracture of third metatarsal bone, left foot, initial encounter for closed fracture: Secondary | ICD-10-CM | POA: Diagnosis present

## 2018-07-16 DIAGNOSIS — J9601 Acute respiratory failure with hypoxia: Secondary | ICD-10-CM | POA: Diagnosis present

## 2018-07-16 DIAGNOSIS — D689 Coagulation defect, unspecified: Secondary | ICD-10-CM | POA: Diagnosis present

## 2018-07-16 DIAGNOSIS — S92342A Displaced fracture of fourth metatarsal bone, left foot, initial encounter for closed fracture: Secondary | ICD-10-CM | POA: Diagnosis present

## 2018-07-16 DIAGNOSIS — G92 Toxic encephalopathy: Secondary | ICD-10-CM | POA: Diagnosis present

## 2018-07-16 DIAGNOSIS — Z452 Encounter for adjustment and management of vascular access device: Secondary | ICD-10-CM

## 2018-07-16 DIAGNOSIS — R579 Shock, unspecified: Secondary | ICD-10-CM | POA: Insufficient documentation

## 2018-07-16 DIAGNOSIS — R740 Nonspecific elevation of levels of transaminase and lactic acid dehydrogenase [LDH]: Secondary | ICD-10-CM

## 2018-07-16 DIAGNOSIS — R4182 Altered mental status, unspecified: Secondary | ICD-10-CM | POA: Diagnosis present

## 2018-07-16 DIAGNOSIS — S92312A Displaced fracture of first metatarsal bone, left foot, initial encounter for closed fracture: Secondary | ICD-10-CM | POA: Diagnosis present

## 2018-07-16 DIAGNOSIS — S92242A Displaced fracture of medial cuneiform of left foot, initial encounter for closed fracture: Secondary | ICD-10-CM | POA: Diagnosis present

## 2018-07-16 DIAGNOSIS — S92322A Displaced fracture of second metatarsal bone, left foot, initial encounter for closed fracture: Secondary | ICD-10-CM | POA: Diagnosis present

## 2018-07-16 DIAGNOSIS — Z9911 Dependence on respirator [ventilator] status: Secondary | ICD-10-CM

## 2018-07-16 DIAGNOSIS — S92352A Displaced fracture of fifth metatarsal bone, left foot, initial encounter for closed fracture: Secondary | ICD-10-CM | POA: Diagnosis present

## 2018-07-16 DIAGNOSIS — J690 Pneumonitis due to inhalation of food and vomit: Secondary | ICD-10-CM | POA: Diagnosis present

## 2018-07-16 LAB — CBC WITH DIFFERENTIAL/PLATELET
Abs Immature Granulocytes: 0.5 10*3/uL — ABNORMAL HIGH (ref 0.0–0.1)
BASOS ABS: 0 10*3/uL (ref 0.0–0.1)
Basophils Relative: 0 %
Eosinophils Absolute: 0 10*3/uL (ref 0.0–0.7)
Eosinophils Relative: 0 %
HCT: 41.3 % (ref 39.0–52.0)
HEMOGLOBIN: 12.9 g/dL — AB (ref 13.0–17.0)
IMMATURE GRANULOCYTES: 3 %
LYMPHS PCT: 10 %
Lymphs Abs: 1.8 10*3/uL (ref 0.7–4.0)
MCH: 29.9 pg (ref 26.0–34.0)
MCHC: 31.2 g/dL (ref 30.0–36.0)
MCV: 95.6 fL (ref 78.0–100.0)
Monocytes Absolute: 1 10*3/uL (ref 0.1–1.0)
Monocytes Relative: 6 %
NEUTROS ABS: 15.1 10*3/uL — AB (ref 1.7–7.7)
NEUTROS PCT: 81 %
Platelets: 114 10*3/uL — ABNORMAL LOW (ref 150–400)
RBC: 4.32 MIL/uL (ref 4.22–5.81)
RDW: 12.1 % (ref 11.5–15.5)
WBC: 18.5 10*3/uL — ABNORMAL HIGH (ref 4.0–10.5)

## 2018-07-16 LAB — POCT I-STAT 3, VENOUS BLOOD GAS (G3P V)
Acid-base deficit: 9 mmol/L — ABNORMAL HIGH (ref 0.0–2.0)
Bicarbonate: 19.7 mmol/L — ABNORMAL LOW (ref 20.0–28.0)
O2 Saturation: 48 %
PCO2 VEN: 58.6 mmHg (ref 44.0–60.0)
PH VEN: 7.144 — AB (ref 7.250–7.430)
TCO2: 21 mmol/L — AB (ref 22–32)
pO2, Ven: 37 mmHg (ref 32.0–45.0)

## 2018-07-16 LAB — BASIC METABOLIC PANEL
ANION GAP: 7 (ref 5–15)
Anion gap: 7 (ref 5–15)
BUN: 29 mg/dL — AB (ref 6–20)
BUN: 30 mg/dL — ABNORMAL HIGH (ref 6–20)
CALCIUM: 6.9 mg/dL — AB (ref 8.9–10.3)
CHLORIDE: 107 mmol/L (ref 98–111)
CO2: 18 mmol/L — ABNORMAL LOW (ref 22–32)
CO2: 18 mmol/L — AB (ref 22–32)
CREATININE: 2.41 mg/dL — AB (ref 0.61–1.24)
Calcium: 6.7 mg/dL — ABNORMAL LOW (ref 8.9–10.3)
Chloride: 104 mmol/L (ref 98–111)
Creatinine, Ser: 2.68 mg/dL — ABNORMAL HIGH (ref 0.61–1.24)
GFR calc Af Amer: 37 mL/min — ABNORMAL LOW (ref >60)
GFR calc non Af Amer: 32 mL/min — ABNORMAL LOW (ref >60)
GFR, EST AFRICAN AMERICAN: 42 mL/min — AB (ref >60)
GFR, EST NON AFRICAN AMERICAN: 36 mL/min — AB (ref >60)
GLUCOSE: 258 mg/dL — AB (ref 70–99)
Glucose, Bld: 255 mg/dL — ABNORMAL HIGH (ref 70–99)
Potassium: 6.5 mmol/L (ref 3.5–5.1)
Potassium: 5.2 mmol/L — ABNORMAL HIGH (ref 3.5–5.1)
SODIUM: 132 mmol/L — AB (ref 135–145)
SODIUM: 129 mmol/L — AB (ref 135–145)

## 2018-07-16 LAB — I-STAT CHEM 8, ED
BUN: 34 mg/dL — ABNORMAL HIGH (ref 6–20)
Calcium, Ion: 0.98 mmol/L — ABNORMAL LOW (ref 1.15–1.40)
Chloride: 104 mmol/L (ref 98–111)
Creatinine, Ser: 3.4 mg/dL — ABNORMAL HIGH (ref 0.61–1.24)
Glucose, Bld: 107 mg/dL — ABNORMAL HIGH (ref 70–99)
HCT: 40 % (ref 39.0–52.0)
Hemoglobin: 13.6 g/dL (ref 13.0–17.0)
Potassium: 7.1 mmol/L (ref 3.5–5.1)
Sodium: 134 mmol/L — ABNORMAL LOW (ref 135–145)
TCO2: 22 mmol/L (ref 22–32)

## 2018-07-16 LAB — COMPREHENSIVE METABOLIC PANEL
ALBUMIN: 3.4 g/dL — AB (ref 3.5–5.0)
ALT: 3096 U/L — ABNORMAL HIGH (ref 0–44)
ANION GAP: 14 (ref 5–15)
AST: 2640 U/L — AB (ref 15–41)
Alkaline Phosphatase: 56 U/L (ref 38–126)
BUN: 27 mg/dL — ABNORMAL HIGH (ref 6–20)
CO2: 20 mmol/L — AB (ref 22–32)
Calcium: 8.1 mg/dL — ABNORMAL LOW (ref 8.9–10.3)
Chloride: 102 mmol/L (ref 98–111)
Creatinine, Ser: 3.24 mg/dL — ABNORMAL HIGH (ref 0.61–1.24)
GFR calc Af Amer: 29 mL/min — ABNORMAL LOW (ref >60)
GFR calc non Af Amer: 25 mL/min — ABNORMAL LOW (ref >60)
GLUCOSE: 113 mg/dL — AB (ref 70–99)
Potassium: 7.3 mmol/L (ref 3.5–5.1)
SODIUM: 136 mmol/L (ref 135–145)
Total Bilirubin: 1.1 mg/dL (ref 0.3–1.2)
Total Protein: 6.2 g/dL — ABNORMAL LOW (ref 6.5–8.1)

## 2018-07-16 LAB — PROTIME-INR
INR: 1.82
Prothrombin Time: 20.9 seconds — ABNORMAL HIGH (ref 11.4–15.2)

## 2018-07-16 LAB — ACETAMINOPHEN LEVEL: Acetaminophen (Tylenol), Serum: <10 ug/mL — ABNORMAL LOW (ref 10–30)

## 2018-07-16 LAB — POCT I-STAT 3, ART BLOOD GAS (G3+)
ACID-BASE DEFICIT: 9 mmol/L — AB (ref 0.0–2.0)
BICARBONATE: 17.8 mmol/L — AB (ref 20.0–28.0)
O2 Saturation: 98 %
PH ART: 7.243 — AB (ref 7.350–7.450)
TCO2: 19 mmol/L — ABNORMAL LOW (ref 22–32)
pCO2 arterial: 42 mmHg (ref 32.0–48.0)
pO2, Arterial: 133 mmHg — ABNORMAL HIGH (ref 83.0–108.0)

## 2018-07-16 LAB — SODIUM, URINE, RANDOM: Sodium, Ur: 122 mmol/L

## 2018-07-16 LAB — URINALYSIS, ROUTINE W REFLEX MICROSCOPIC
Bilirubin Urine: NEGATIVE
Glucose, UA: 50 mg/dL — AB
Hgb urine dipstick: NEGATIVE
KETONES UR: NEGATIVE mg/dL
LEUKOCYTES UA: NEGATIVE
Nitrite: NEGATIVE
PH: 5 (ref 5.0–8.0)
Protein, ur: NEGATIVE mg/dL
Specific Gravity, Urine: 1.016 (ref 1.005–1.030)

## 2018-07-16 LAB — TRIGLYCERIDES: TRIGLYCERIDES: 175 mg/dL — AB (ref <150)

## 2018-07-16 LAB — RAPID URINE DRUG SCREEN, HOSP PERFORMED
Amphetamines: NOT DETECTED
BARBITURATES: NOT DETECTED
BENZODIAZEPINES: POSITIVE — AB
Cocaine: POSITIVE — AB
Opiates: POSITIVE — AB
Tetrahydrocannabinol: POSITIVE — AB

## 2018-07-16 LAB — OSMOLALITY: Osmolality: 306 mOsm/kg — ABNORMAL HIGH (ref 275–295)

## 2018-07-16 LAB — I-STAT TROPONIN, ED: TROPONIN I, POC: 2.48 ng/mL — AB (ref 0.00–0.08)

## 2018-07-16 LAB — TSH: TSH: 0.561 u[IU]/mL (ref 0.350–4.500)

## 2018-07-16 LAB — GLUCOSE, CAPILLARY
GLUCOSE-CAPILLARY: 213 mg/dL — AB (ref 70–99)
Glucose-Capillary: 256 mg/dL — ABNORMAL HIGH (ref 70–99)

## 2018-07-16 LAB — LACTIC ACID, PLASMA: LACTIC ACID, VENOUS: 4.2 mmol/L — AB (ref 0.5–1.9)

## 2018-07-16 LAB — PHOSPHORUS: Phosphorus: 5 mg/dL — ABNORMAL HIGH (ref 2.5–4.6)

## 2018-07-16 LAB — CK: CK TOTAL: 1173 U/L — AB (ref 49–397)

## 2018-07-16 LAB — MRSA PCR SCREENING: MRSA BY PCR: POSITIVE — AB

## 2018-07-16 LAB — ETHANOL

## 2018-07-16 LAB — MAGNESIUM
MAGNESIUM: 2 mg/dL (ref 1.7–2.4)
Magnesium: 2 mg/dL (ref 1.7–2.4)

## 2018-07-16 LAB — I-STAT CG4 LACTIC ACID, ED
LACTIC ACID, VENOUS: 3.84 mmol/L — AB (ref 0.5–1.9)
LACTIC ACID, VENOUS: 3.7 mmol/L — AB (ref 0.5–1.9)

## 2018-07-16 LAB — SALICYLATE LEVEL

## 2018-07-16 LAB — AMMONIA: Ammonia: 47 umol/L — ABNORMAL HIGH (ref 9–35)

## 2018-07-16 LAB — LIPASE, BLOOD: Lipase: 34 U/L (ref 11–51)

## 2018-07-16 LAB — CREATININE, URINE, RANDOM: CREATININE, URINE: 131.74 mg/dL

## 2018-07-16 MED ORDER — SODIUM CHLORIDE 0.9 % IV SOLN
1.0000 g | Freq: Once | INTRAVENOUS | Status: AC
  Administered 2018-07-16: 1 g via INTRAVENOUS
  Filled 2018-07-16: qty 10

## 2018-07-16 MED ORDER — SODIUM CHLORIDE 0.9 % IV BOLUS
1000.0000 mL | Freq: Once | INTRAVENOUS | Status: AC
  Administered 2018-07-16: 1000 mL via INTRAVENOUS

## 2018-07-16 MED ORDER — PROPOFOL 1000 MG/100ML IV EMUL
5.0000 ug/kg/min | INTRAVENOUS | Status: DC
  Filled 2018-07-16: qty 100

## 2018-07-16 MED ORDER — VANCOMYCIN HCL 10 G IV SOLR
1250.0000 mg | INTRAVENOUS | Status: DC
  Filled 2018-07-16: qty 1250

## 2018-07-16 MED ORDER — SODIUM CHLORIDE 0.9 % IV SOLN: INTRAVENOUS | Status: DC

## 2018-07-16 MED ORDER — ETOMIDATE 2 MG/ML IV SOLN
INTRAVENOUS | Status: AC | PRN
  Administered 2018-07-16: 20 mg via INTRAVENOUS

## 2018-07-16 MED ORDER — SODIUM BICARBONATE 8.4 % IV SOLN
50.0000 meq | Freq: Once | INTRAVENOUS | Status: AC
  Administered 2018-07-16: 50 meq via INTRAVENOUS
  Filled 2018-07-16: qty 50

## 2018-07-16 MED ORDER — INSULIN ASPART 100 UNIT/ML ~~LOC~~ SOLN
1.0000 [IU] | SUBCUTANEOUS | Status: DC
  Administered 2018-07-17: 2 [IU] via SUBCUTANEOUS
  Administered 2018-07-17 (×2): 1 [IU] via SUBCUTANEOUS
  Administered 2018-07-17: 3 [IU] via SUBCUTANEOUS
  Administered 2018-07-18: 2 [IU] via SUBCUTANEOUS
  Administered 2018-07-18 (×2): 1 [IU] via SUBCUTANEOUS
  Administered 2018-07-18: 2 [IU] via SUBCUTANEOUS
  Administered 2018-07-19: 1 [IU] via SUBCUTANEOUS

## 2018-07-16 MED ORDER — NOREPINEPHRINE 4 MG/250ML-% IV SOLN
0.0000 ug/min | INTRAVENOUS | Status: DC
  Administered 2018-07-16: 21.333 ug/min via INTRAVENOUS
  Administered 2018-07-16: 20 ug/min via INTRAVENOUS
  Filled 2018-07-16 (×4): qty 250

## 2018-07-16 MED ORDER — DEXTROSE 50 % IV SOLN
1.0000 | Freq: Once | INTRAVENOUS | Status: AC
  Administered 2018-07-16: 50 mL via INTRAVENOUS
  Filled 2018-07-16: qty 50

## 2018-07-16 MED ORDER — VANCOMYCIN HCL IN DEXTROSE 1-5 GM/200ML-% IV SOLN: 1000.0000 mg | Freq: Once | INTRAVENOUS | Status: DC

## 2018-07-16 MED ORDER — NOREPINEPHRINE 4 MG/250ML-% IV SOLN
0.0000 ug/min | Freq: Once | INTRAVENOUS | Status: AC
  Administered 2018-07-16: 5 ug/min via INTRAVENOUS
  Filled 2018-07-16: qty 250

## 2018-07-16 MED ORDER — CHLORHEXIDINE GLUCONATE 0.12% ORAL RINSE (MEDLINE KIT)
15.0000 mL | Freq: Two times a day (BID) | OROMUCOSAL | Status: DC
  Administered 2018-07-16 – 2018-07-19 (×7): 15 mL via OROMUCOSAL

## 2018-07-16 MED ORDER — SODIUM CHLORIDE 0.9 % IV SOLN
1.0000 g | Freq: Three times a day (TID) | INTRAVENOUS | Status: DC
  Administered 2018-07-17 – 2018-07-18 (×4): 1 g via INTRAVENOUS
  Filled 2018-07-16 (×7): qty 1

## 2018-07-16 MED ORDER — ACETAMINOPHEN 650 MG RE SUPP: 650.0000 mg | Freq: Four times a day (QID) | RECTAL | Status: DC | PRN

## 2018-07-16 MED ORDER — STERILE WATER FOR INJECTION IV SOLN
INTRAVENOUS | Status: DC
  Administered 2018-07-16 – 2018-07-17 (×2): via INTRAVENOUS
  Filled 2018-07-16 (×4): qty 850

## 2018-07-16 MED ORDER — MIDAZOLAM HCL 2 MG/2ML IJ SOLN
2.0000 mg | INTRAMUSCULAR | Status: DC | PRN
  Administered 2018-07-18 – 2018-07-19 (×5): 2 mg via INTRAVENOUS
  Filled 2018-07-16 (×6): qty 2

## 2018-07-16 MED ORDER — SODIUM POLYSTYRENE SULFONATE 15 GM/60ML PO SUSP
30.0000 g | Freq: Once | ORAL | Status: AC
  Administered 2018-07-16: 30 g
  Filled 2018-07-16: qty 120

## 2018-07-16 MED ORDER — PROPOFOL 10 MG/ML IV BOLUS
INTRAVENOUS | Status: AC
  Filled 2018-07-16: qty 20

## 2018-07-16 MED ORDER — FENTANYL CITRATE (PF) 100 MCG/2ML IJ SOLN
50.0000 ug | INTRAMUSCULAR | Status: DC | PRN
  Administered 2018-07-17 – 2018-07-20 (×10): 50 ug via INTRAVENOUS
  Filled 2018-07-16 (×10): qty 2

## 2018-07-16 MED ORDER — HEPARIN SODIUM (PORCINE) 5000 UNIT/ML IJ SOLN
5000.0000 [IU] | Freq: Three times a day (TID) | INTRAMUSCULAR | Status: DC
  Administered 2018-07-16: 5000 [IU] via SUBCUTANEOUS
  Filled 2018-07-16: qty 1

## 2018-07-16 MED ORDER — ACETAMINOPHEN 160 MG/5ML PO SOLN: 650.0000 mg | Freq: Four times a day (QID) | ORAL | Status: DC | PRN

## 2018-07-16 MED ORDER — VASOPRESSIN 20 UNIT/ML IV SOLN
0.0300 [IU]/min | INTRAVENOUS | Status: DC
  Administered 2018-07-16: 0.03 [IU]/min via INTRAVENOUS
  Filled 2018-07-16: qty 2

## 2018-07-16 MED ORDER — VANCOMYCIN HCL 10 G IV SOLR
1750.0000 mg | Freq: Once | INTRAVENOUS | Status: AC
  Administered 2018-07-16: 1750 mg via INTRAVENOUS
  Filled 2018-07-16: qty 1750

## 2018-07-16 MED ORDER — MUPIROCIN 2 % EX OINT
1.0000 "application " | TOPICAL_OINTMENT | Freq: Two times a day (BID) | CUTANEOUS | Status: DC
  Administered 2018-07-16 – 2018-07-20 (×9): 1 via NASAL
  Filled 2018-07-16 (×3): qty 22

## 2018-07-16 MED ORDER — PANTOPRAZOLE SODIUM 40 MG PO PACK
40.0000 mg | PACK | ORAL | Status: DC
  Administered 2018-07-16 – 2018-07-18 (×3): 40 mg
  Filled 2018-07-16 (×4): qty 20

## 2018-07-16 MED ORDER — PROPOFOL 1000 MG/100ML IV EMUL
INTRAVENOUS | Status: AC
  Filled 2018-07-16: qty 100

## 2018-07-16 MED ORDER — SUCCINYLCHOLINE CHLORIDE 20 MG/ML IJ SOLN
INTRAMUSCULAR | Status: AC | PRN
  Administered 2018-07-16: 100 mg via INTRAVENOUS

## 2018-07-16 MED ORDER — IOPAMIDOL (ISOVUE-370) INJECTION 76%
INTRAVENOUS | Status: AC
  Filled 2018-07-16: qty 100

## 2018-07-16 MED ORDER — NALOXONE HCL 2 MG/2ML IJ SOSY
PREFILLED_SYRINGE | INTRAMUSCULAR | Status: AC
  Filled 2018-07-16: qty 2

## 2018-07-16 MED ORDER — INSULIN ASPART 100 UNIT/ML IV SOLN
10.0000 [IU] | Freq: Once | INTRAVENOUS | Status: AC
  Administered 2018-07-16: 10 [IU] via INTRAVENOUS
  Filled 2018-07-16: qty 0.1

## 2018-07-16 MED ORDER — CHLORHEXIDINE GLUCONATE CLOTH 2 % EX PADS: 6.0000 | MEDICATED_PAD | Freq: Every day | CUTANEOUS | Status: DC

## 2018-07-16 MED ORDER — DOCUSATE SODIUM 50 MG/5ML PO LIQD
100.0000 mg | Freq: Two times a day (BID) | ORAL | Status: DC | PRN
  Filled 2018-07-16: qty 10

## 2018-07-16 MED ORDER — SODIUM CHLORIDE 0.9 % IV SOLN
2.0000 g | Freq: Once | INTRAVENOUS | Status: AC
  Administered 2018-07-16: 2 g via INTRAVENOUS
  Filled 2018-07-16: qty 2

## 2018-07-16 MED ORDER — IOPAMIDOL (ISOVUE-370) INJECTION 76%
100.0000 mL | Freq: Once | INTRAVENOUS | Status: AC | PRN
  Administered 2018-07-16: 100 mL via INTRAVENOUS

## 2018-07-16 MED ORDER — ACETAMINOPHEN 650 MG RE SUPP: 650.0000 mg | RECTAL | Status: DC | PRN

## 2018-07-16 MED ORDER — ACETAMINOPHEN 160 MG/5ML PO SOLN
650.0000 mg | Freq: Four times a day (QID) | ORAL | Status: DC | PRN
  Administered 2018-07-16: 650 mg
  Filled 2018-07-16: qty 20.3

## 2018-07-16 MED ORDER — BISACODYL 10 MG RE SUPP: 10.0000 mg | Freq: Every day | RECTAL | Status: DC | PRN

## 2018-07-16 MED ORDER — ORAL CARE MOUTH RINSE
15.0000 mL | OROMUCOSAL | Status: DC
  Administered 2018-07-16 – 2018-07-19 (×30): 15 mL via OROMUCOSAL

## 2018-07-16 NOTE — Progress Notes (Signed)
CRITICAL VALUE ALERT  Critical Value: potassium 6.5  Date & Time Notied:  10/6 1743  Provider Notified: Dr. Molli Knock  Orders Received/Actions taken: No orders received, potassium improving from last result

## 2018-07-16 NOTE — Progress Notes (Signed)
Chaplain responded to request to be with family at 1:56 PM.  Pt located in Trauma B however at this time was in CT. Pt ventilated, unresponsive.  Chaplain met with mother in Consult B and brought in pt's family, including fiance into Consult B. Learned that pt and fiance have 23 year old. Pt fell from deer stand while hunting last week and had been receiving care for broken foot.  "She needs his father!" says fiancee.  Doctor came and gave report to family and chaplain provided emotional support.   Will continue to follow.  Rev. Virginia Wood  

## 2018-07-16 NOTE — Consult Note (Signed)
Renal Service Consult Note [facility]  Randy Mcintyre 07/16/2018 Maree Krabbe Requesting Physician: Dr Craige Cotta  Reason for Consult:  Acute renal failure HPI: The patient is a 22 y.o. year-old presented found unresponsive at home this morning.  Pt had recent fall and L foot fracture, was taking pain meds, nsaids and neurontin.  Reportedly he was lethargic this AM and was left to rest.  3 hrs later he was unresponsive and EMS called. He was given Narcan several times by EMS and IV in ED w/o response.  ABG showed resp acidosis and pt was intubated.  CTA chest showed no PE.  BMet showed creat 3.40 (1.1 on 9/28), AST 2640 and ALT 3096, alb 3.6, Tbili 1.1, normal lipase. CPK was around 1100.  K was 7.1, given IV Ca gluconate.  Temps in ED were 95deg then 101 deg.  BP's were very low in the 70's (EMS recorded BP's in the 40's).  UDS was + for cocaine, BZD, opiates and THC.  CXR showed bibasilar atx.  Asked to see for AKI.    No family here.  No hx obtainable. Home meds were just recently prescribed neurontin, norco, advil, mobic and miralax prn.    ROS  n/a  Past Medical History  Past Medical History:  Diagnosis Date  . Anxiety   . Complication of anesthesia 07/08/2018   pation had a reducation and splitting in ED- "they said that it took 3 times the amount it should have."  . Medical history non-contributory    Past Surgical History History reviewed. No pertinent surgical history. Family History No family history on file. Social History  reports that he has been smoking. He has smoked for the past 7.00 years. His smokeless tobacco use includes chew. He reports that he drinks about 24.0 standard drinks of alcohol per week. He reports that he has current or past drug history. Drug: Marijuana. Allergies  Allergies  Allergen Reactions  . Penicillins Other (See Comments)    Severe mouth ulcers Has patient had a PCN reaction causing immediate rash, facial/tongue/throat swelling, SOB  or lightheadedness with hypotension: No HAS PT DEVELOPED SEVERE RASH INVOLVING MUCUS MEMBRANES or SKIN NECROSIS:  YES SEVERE ORAL ULCERS. Has patient had a PCN reaction that required hospitalization: No Has patient had a PCN reaction occurring within the last 10 years: Yes If all of the above answers are "NO", then may proceed with Cephalosporin use.   Home medications Prior to Admission medications   Medication Sig Start Date End Date Taking? Authorizing Provider  gabapentin (NEURONTIN) 300 MG capsule Take 300 mg by mouth at bedtime.   Yes [provider]  HYDROcodone-acetaminophen (NORCO/VICODIN) 5-325 MG tablet Take 1 tablet by mouth every 4 (four) hours as needed for up to 24 doses. Patient taking differently: Take 1 tablet by mouth every 4 (four) hours as needed (pain).  07/08/18  Yes Margit Banda, MD  meloxicam (MOBIC) 15 MG tablet Take 15 mg by mouth daily.   Yes [provider]  polyethylene glycol (MIRALAX / GLYCOLAX) packet Take 17 g by mouth daily.   Yes [provider]  ibuprofen (ADVIL,MOTRIN) 200 MG tablet Take 600 mg by mouth every 6 (six) hours as needed for headache (pain).    [provider]   Liver Function Tests Recent Labs  Lab 07/16/18 1333  AST 2,640*  ALT 3,096*  ALKPHOS 56  BILITOT 1.1  PROT 6.2*  ALBUMIN 3.4*   Recent Labs  Lab 07/16/18 1333  LIPASE  34   CBC Recent Labs  Lab 07/16/18 1333 07/16/18 1338  WBC 18.5*  --   NEUTROABS 15.1*  --   HGB 12.9* 13.6  HCT 41.3 40.0  MCV 95.6  --   PLT 114*  --    Basic Metabolic Panel Recent Labs  Lab 07/16/18 1333 07/16/18 1338 07/16/18 1535  NA 136 134*  --   K 7.3* 7.1*  --   CL 102 104  --   CO2 20*  --   --   GLUCOSE 113* 107*  --   BUN 27* 34*  --   CREATININE 3.24* 3.40*  --   CALCIUM 8.1*  --   --   PHOS  --   --  5.0*   Iron/TIBC/Ferritin/ %Sat No results found for: IRON, TIBC, FERRITIN, IRONPCTSAT  Vitals:   07/16/18 1510 07/16/18 1600  07/16/18 1620 07/16/18 1700  BP: (!) 93/44 99/70  (!) 99/55  Pulse: (!) 109 (!) 104  (!) 104  Resp: (!) 26 (!) 26  (!) 26  Temp:    (!) 101.3 F (38.5 C)  TempSrc:      SpO2: 96% 100% 98% 98%   Exam Gen intubated, unusual dusky skin of the R shoulder and face On IV pressor support w/ levo gtt and vaso gtt No rash, cyanosis or gangrene Sclera anicteric, throat w ETT  No jvd or bruits Chest bilat course rhonchi RRR no MRG  Abd soft ntnd no mass or ascites +bs GU normal male MS no joint effusions or deformity Ext no LE or UE edema, Left foot in a boot/ cast Neuro is entirely unresponsive  Renal US - normal kidneys  Impression: 1. Acute renal failure - had normal creat in ED here a couple of weeks ago.  Suspect ATN due to shock.  No evidence sig rhabdomyolysis, CPK only 1100. Would place foley and give generous amounts of NS for now. Supportive care, CRRT when needed.   2. ^LFT's - prob shock liver from severe hypotension 3. Metabolic + resp acidosis - per ABG, anion gap not elevated 4. Hyperkalemia - K 7.1, sp IV insulin/ glucose/ Ca. Repeat pending.  5. Severe shock - BP's 40's per EMS. Suspect overdose? On pressors here.  6. AMS - not responding, not sure how long down 7. R foot fracture -recent event 8. Polysubstance abuse - per UDS   Plan - as above  Vinson Moselle MD [facility] pager [phone]   07/16/2018, 5:15 PM

## 2018-07-16 NOTE — Progress Notes (Signed)
STAT EEG completed; results pending. 

## 2018-07-16 NOTE — Progress Notes (Addendum)
Floor RT given report.  MD unable to place a-line x 2 attempts.  Per MD, he will try again once pt is transferred to ICU.  ABG to be done then.  MD aware that while ED MD was intubating, there appeared to be greenish colored pill in the oropharynx area.   Late entry- pt was placed on 10 peep per Dr. Mariel Aloe d/t pt was initially desaturating 87-88% on 100% and 8 peep.  This was discussed w/ Dr Molli Knock in the ED.

## 2018-07-16 NOTE — Progress Notes (Signed)
RT NOTES: Attempted to obtain ABG. Venous blood obtained. Results given to CCM. CCM will attempt to placed a-line and will repeat gas post a-line placement.

## 2018-07-16 NOTE — H&P (Signed)
NAME:  Randy Mcintyre, MRN:  [id number], DOB:  [birth year], LOS: 0 ADMISSION DATE:  07/16/2018, CONSULTATION DATE:  07/16/2018 REFERRING MD:  EDP - Long, CHIEF COMPLAINT:  Obtunded   Brief History   22 year old previously active male who fell from a deerstand and fractured his right foot.  Patient was given mobic, norco and neurontin.  Patient was lethargic this AM and was left to rest.  3 hours later was found unresponsive and EMS was called.  Narcan was given without effect.  Patient was brought to the ER where he was intubated and a pill fragment was found in the back of his throat.  PCCM was called to admit.  CT of the chest negative for PE and head CT was negative.  Past Medical History  None Significant Hospital Events   10/6 admission for respiratory failure, renal failure and shocked liver  Consults: date of consult/date signed off & final recs:  Neurology  Procedures (surgical and bedside):  ETT 10/6>>> L IJ TLC 10/6>>>  Significant Diagnostic Tests:  Head CT 10/6>>>Negative Chest CT 10/6>>>-PE but there is LLL infiltrate  Micro Data:  Blood 10/6>>> Urine 10/6>>> Sputum 10/6>>>  Antimicrobials:  Vanc 10/6>>> Aztreonam 10/6>>>   Subjective:  Obtunded  Objective   Blood pressure (!) 93/44, pulse (!) 109, temperature (!) 95.2 F (35.1 C), temperature source Temporal, resp. rate (!) 26, SpO2 96 %.    Vent Mode: PRVC FiO2 (%):  [100 %] 100 % Set Rate:  [20 bmp] 20 bmp Vt Set:  [570 mL] 570 mL PEEP:  [8 cmH20] 8 cmH20 Plateau Pressure:  [24 cmH20] 24 cmH20  No intake or output data in the 24 hours ending 07/16/18 1521 There were no vitals filed for this visit.  Examination: General: Well appearing, NAD HENT: [place]/AT, PERRL, EOM-I and MMM Lungs: Bibasilar crackles L>R Cardiovascular: RRR, Nl S1/S2 and -M/R/G Abdomen: Soft, NT, ND and +BS Extremities: -edema and -tenderness Neuro: Completely unresponsive  Skin: bruise  Resolved Hospital Problem list    None  Assessment & Plan:  22 year old male with no significant PMH other than the fracture above who presents to PCCM unresponsive and in respiratory failure.  I suspect that patient was taken mobic and dehydrated resulting in renal failure and with accummulation of more narcotics in his system he was obtunded, hypotensive and shocked his liver.  Discussed with PCCM-NP.  VDRF:  - Full vent support  - ABG  - Adjust vent for ABG  - Titrate O2 for sat of 88-92%  - VAP prevention  Circulatory shock: septic vs hypovolemic  - Aggressive hydration  - Follow CVP  - Levophed for BP support  - Vasopressin  - Treat infection as below  Aspiration pneumonia:  - Vanc/aztreonam  - Pan cultures  Acute renal failure:  - Very aggressive hydration  - ?Bicarb drip  - Renal consult  Hyperkalemia:  - Calcium  - Glucose/insulin  - Bicarb  - BMET in AM  - BMET at 10 PM  - Aggressive hydration  Shocked liver:  - Maintain perfusion  - LFTs in AM  Acute encephalopathy: not responsive to narcan  - EEG  - Neuro consult  - Correct electrolytes  - Check ammonia level  Disposition / Summary of Today's Plan 07/16/18   See above  Labs   CBC: Recent Labs  Lab 07/16/18 1333 07/16/18 1338  WBC 18.5*  --   NEUTROABS 15.1*  --   HGB 12.9* 13.6  HCT 41.3  40.0  MCV 95.6  --   PLT 114*  --     Basic Metabolic Panel: Recent Labs  Lab 07/16/18 1333 07/16/18 1338  NA 136 134*  K 7.3* 7.1*  CL 102 104  CO2 20*  --   GLUCOSE 113* 107*  BUN 27* 34*  CREATININE 3.24* 3.40*  CALCIUM 8.1*  --    GFR: Estimated Creatinine Clearance: 37.9 mL/min (A) (by C-G formula based on SCr of 3.4 mg/dL (H)). Recent Labs  Lab 07/16/18 1333 07/16/18 1338  WBC 18.5*  --   LATICACIDVEN  --  3.84*    Liver Function Tests: Recent Labs  Lab 07/16/18 1333  AST 2,640*  ALT 3,096*  ALKPHOS 56  BILITOT 1.1  PROT 6.2*  ALBUMIN 3.4*   Recent Labs  Lab 07/16/18 1333  LIPASE 34   No results  for input(s): AMMONIA in the last 168 hours.  ABG    Component Value Date/Time   TCO2 22 07/16/2018 1338     Coagulation Profile: No results for input(s): INR, PROTIME in the last 168 hours.  Cardiac Enzymes: No results for input(s): CKTOTAL, CKMB, CKMBINDEX, TROPONINI in the last 168 hours.  HbA1C: No results found for: HGBA1C  CBG: No results for input(s): GLUCAP in the last 168 hours.  Admitting History of Present Illness.   22 year old previously active male who fell from a deerstand and fractured his right foot.  Patient was given mobic, norco and neurontin.  Patient was lethargic this AM and was left to rest.  3 hours later was found unresponsive and EMS was called.  Narcan was given without effect.  Patient was brought to the ER where he was intubated and a pill fragment was found in the back of his throat.  PCCM was called to admit.  CT of the chest negative for PE and head CT was negative.  Review of Systems:   Unattainable  Past Medical History  He,  has a past medical history of Anxiety, Complication of anesthesia (07/08/2018), and Medical history non-contributory.   Surgical History   History reviewed. No pertinent surgical history.   Social History   Social History   Socioeconomic History  . Marital status: Single    Spouse name: Not on file  . Number of children: Not on file  . Years of education: Not on file  . Highest education level: Not on file  Occupational History  . Not on file  Social Needs  . Financial resource strain: Not on file  . Food insecurity:    Worry: Not on file    Inability: Not on file  . Transportation needs:    Medical: Not on file    Non-medical: Not on file  Tobacco Use  . Smoking status: Current Some Day Smoker    Years: 7.00  . Smokeless tobacco: Current User    Types: Chew  . Tobacco comment: maybe 10 cigs a week,  Chew- 2 pouches a week   Substance and Sexual Activity  . Alcohol use: Yes    Alcohol/week: 24.0  standard drinks    Types: 24 Cans of beer per week  . Drug use: Yes    Types: Marijuana    Comment: last time 07/13/18  . Sexual activity: Not on file  Lifestyle  . Physical activity:    Days per week: Not on file    Minutes per session: Not on file  . Stress: Not on file  Relationships  . Social connections:  Talks on phone: Not on file    Gets together: Not on file    Attends religious service: Not on file    Active member of club or organization: Not on file    Attends meetings of clubs or organizations: Not on file    Relationship status: Not on file  . Intimate partner violence:    Fear of current or ex partner: Not on file    Emotionally abused: Not on file    Physically abused: Not on file    Forced sexual activity: Not on file  Other Topics Concern  . Not on file  Social History Narrative  . Not on file  ,  reports that he has been smoking. He has smoked for the past 7.00 years. His smokeless tobacco use includes chew. He reports that he drinks about 24.0 standard drinks of alcohol per week. He reports that he has current or past drug history. Drug: Marijuana.   Family History   His family history is not on file.   Allergies Allergies  Allergen Reactions  . Penicillins Other (See Comments)    Severe mouth ulcers Has patient had a PCN reaction causing immediate rash, facial/tongue/throat swelling, SOB or lightheadedness with hypotension: No HAS PT DEVELOPED SEVERE RASH INVOLVING MUCUS MEMBRANES or SKIN NECROSIS:  YES SEVERE ORAL ULCERS. Has patient had a PCN reaction that required hospitalization: No Has patient had a PCN reaction occurring within the last 10 years: Yes If all of the above answers are "NO", then may proceed with Cephalosporin use.     Home Medications  Prior to Admission medications   Medication Sig Start Date End Date Taking? Authorizing Provider  gabapentin (NEURONTIN) 300 MG capsule Take 300 mg by mouth at bedtime.   Yes [provider]  HYDROcodone-acetaminophen (NORCO/VICODIN) 5-325 MG tablet Take 1 tablet by mouth every 4 (four) hours as needed for up to 24 doses. Patient taking differently: Take 1 tablet by mouth every 4 (four) hours as needed (pain).  07/08/18  Yes Margit Banda, MD  meloxicam (MOBIC) 15 MG tablet Take 15 mg by mouth daily.   Yes [provider]  polyethylene glycol (MIRALAX / GLYCOLAX) packet Take 17 g by mouth daily.   Yes [provider]  ibuprofen (ADVIL,MOTRIN) 200 MG tablet Take 600 mg by mouth every 6 (six) hours as needed for headache (pain).    [provider]    The patient is critically ill with multiple organ systems failure and requires high complexity decision making for assessment and support, frequent evaluation and titration of therapies, application of advanced monitoring technologies and extensive interpretation of multiple databases.   Critical Care Time devoted to patient care services described in this note is  60  Minutes. This time reflects time of care of this signee Dr Koren Bound. This critical care time does not reflect procedure time, or teaching time or supervisory time of PA/NP/Med student/Med Resident etc but could involve care discussion time.  Alyson Reedy, M.D. [facility] Pulmonary/Critical Care Medicine. Pager: [phone]. After hours pager: [phone].

## 2018-07-16 NOTE — ED Triage Notes (Signed)
Patient arrived by [facility] following patient being found unresponsive by friends/family.  Patient last seen well at 630 am and was normal and reported minimal pain to fx of of left foot. Per EMS patient found unresponsive at 1030am and no 911 call. At 1230 EMS responded to home and fire had administered nasal 2mg  of narcan after finding empty hydrocodone bottle. Scheduled for surgery tomorrow to fix fracture. On arrival to ED unresponsive with no gag and hypotensive. Additional 2mg  narcan administered IV on arrival with no change in condition. Had NPA on arrival/ CBG  110 Neck swelling noted on arrival.

## 2018-07-16 NOTE — Procedures (Signed)
Central Venous Catheter Insertion Procedure Note Harrell Niehoff [id number] 1996-08-06  Procedure: Insertion of Central Venous Catheter Indications: Assessment of intravascular volume, Drug and/or fluid administration and Frequent blood sampling  Procedure Details Consent: Risks of procedure as well as the alternatives and risks of each were explained to the (patient/caregiver).  Consent for procedure obtained. Time Out: Verified patient identification, verified procedure, site/side was marked, verified correct patient position, special equipment/implants available, medications/allergies/relevent history reviewed, required imaging and test results available.  Performed  Maximum sterile technique was used including antiseptics, cap, gloves, gown, hand hygiene, mask and sheet. Skin prep: Chlorhexidine; local anesthetic administered A antimicrobial bonded/coated triple lumen catheter was placed in the left internal jugular vein using the Seldinger technique.  Evaluation Blood flow good Complications: No apparent complications Patient did tolerate procedure well. Chest X-ray ordered to verify placement.  CXR: Line crosses and rises slightly, ok to use.  YACOUB,WESAM 07/16/2018, 4:03 PM

## 2018-07-16 NOTE — Procedures (Signed)
History: 22 yo M being evaluated for unresponsiveness.   Sedation: Propofol stopped 1hr prior to study  Technique: This is a 21 channel routine scalp EEG performed at the bedside with bipolar and monopolar montages arranged in accordance to the international 10/20 system of electrode placement. One channel was dedicated to EKG recording.    Background: The background consists of realized irregular delta activity with superimposed bifrontally predominant beta activity.  There is no clear posterior dominant rhythm observed.  There is no epileptiform or periodic activity seen.  Photic stimulation: Physiologic driving is not performed  EEG Abnormalities: 1) generalized irregular slow activity 2) excess beta activity  Clinical Interpretation: This EEG is consistent with a generalized nonspecific cerebral dysfunction.  Though nonspecific this can be seen with sedating medications.  There was no seizure or seizure predisposition recorded on this study. Please note that a normal EEG does not preclude the possibility of epilepsy.   Ritta Slot, MD [facility] [phone]  If 7pm- 7am, please page neurology on call as listed in AMION.

## 2018-07-16 NOTE — Progress Notes (Addendum)
PCCM Interval Note   Patient now with temp 101.1. Cultures pending. Has received Azactam and vancomycin. UDS positive for Cocaine, Opiates, THC, Benzo.  Mother updated, gave permission to give information to [facility] Steffanie Rainwater).   Jovita Kussmaul, AGACNP-BC [place] [facility]  PCCM Pgr: [phone]

## 2018-07-16 NOTE — ED Provider Notes (Signed)
Emergency Department Provider Note   I have reviewed the triage vital signs and the nursing notes.   HISTORY  Chief Complaint No chief complaint on file.   HPI Randy Mcintyre is a 22 y.o. male with PMH of tobacco use s/p recent leg fracture on 09/28 resents to the emergency department for being found unresponsive.  The patient was last seen normal at 6:30 AM.  The patient's roommate woke him and asked if he needed any pain medication for the leg.  He verbally responded and then went back to sleep.  The patient was last checked around 10:30 AM and he was unresponsive.  They report seeing sweating, rapid breathing, but the patient was not responding verbally.  EMS was called who found the patient to be tachycardic and hypoxic.  They gave 2 mg of intranasal Narcan on scene with no improvement.  Family state the patient was not complaining of fever, chills, chest pain, shortness of breath prior to going to bed last night. Patient was found with an empty bottle of oxycodone. He was scheduled to have surgery in the AM.   Level 5 caveat: Unresponsive    Past Medical History:  Diagnosis Date  . Anxiety   . Complication of anesthesia 07/08/2018   pation had a reducation and splitting in ED- "they said that it took 3 times the amount it should have."  . Medical history non-contributory     Patient Active Problem List   Diagnosis Date Noted  . Altered mental status 07/16/2018  . Shock circulatory ([place])     History reviewed. No pertinent surgical history.  Allergies Penicillins  No family history on file.  Social History Social History   Tobacco Use  . Smoking status: Current Some Day Smoker    Years: 7.00  . Smokeless tobacco: Current User    Types: Chew  . Tobacco comment: maybe 10 cigs a week,  Chew- 2 pouches a week   Substance Use Topics  . Alcohol use: Yes    Alcohol/week: 24.0 standard drinks    Types: 24 Cans of beer per week  . Drug use: Yes    Types: Marijuana   Comment: last time 07/13/18    Review of Systems  Level 5 caveat: Unresponsive - peri-intubation   ____________________________________________   PHYSICAL EXAM:  VITAL SIGNS: ED Triage Vitals [07/16/18 1318]  Enc Vitals Group     BP (!) 93/46     Pulse Rate (!) 126     Resp (!) 32     Temp (!) 95.2 F (35.1 C)     Temp Source Temporal     SpO2 (!) 80 %   Constitutional: Obtunded. Minimally responsive to pain. No obvious seizure activity.  Eyes: Conjunctivae are normal. Pupils are 2 mm and sluggishly reactive.  Head: Atraumatic. Nose: No congestion/rhinnorhea. Mouth/Throat: Mucous membranes are moist.  Oropharynx non-erythematous. Neck: No stridor. C-collar in place.  Cardiovascular: Sinus tachycardia. Clammy to touch and cool extremities.  Respiratory: Increased respiratory effort.  No retractions. Lungs without wheezing or rhonchi.  Gastrointestinal: Soft. No distention.  Musculoskeletal: Splint in the LLE. No gross deformities.  Neurologic:  GCS 3.  Skin:  Skin is cool and clammy.   ____________________________________________   LABS (all labs ordered are listed, but only abnormal results are displayed)  Labs Reviewed  COMPREHENSIVE METABOLIC PANEL - Abnormal; Notable for the following components:      Result Value   Potassium 7.3 (*)    CO2 20 (*)  Glucose, Bld 113 (*)    BUN 27 (*)    Creatinine, Ser 3.24 (*)    Calcium 8.1 (*)    Total Protein 6.2 (*)    Albumin 3.4 (*)    AST 2,640 (*)    ALT 3,096 (*)    GFR calc non Af Amer 25 (*)    GFR calc Af Amer 29 (*)    All other components within normal limits  ACETAMINOPHEN LEVEL - Abnormal; Notable for the following components:   Acetaminophen (Tylenol), Serum <10 (*)    All other components within normal limits  CBC WITH DIFFERENTIAL/PLATELET - Abnormal; Notable for the following components:   WBC 18.5 (*)    Hemoglobin 12.9 (*)    Platelets 114 (*)    Neutro Abs 15.1 (*)    Abs Immature  Granulocytes 0.5 (*)    All other components within normal limits  URINALYSIS, ROUTINE W REFLEX MICROSCOPIC - Abnormal; Notable for the following components:   Glucose, UA 50 (*)    All other components within normal limits  RAPID URINE DRUG SCREEN, HOSP PERFORMED - Abnormal; Notable for the following components:   Opiates POSITIVE (*)    Cocaine POSITIVE (*)    Benzodiazepines POSITIVE (*)    Tetrahydrocannabinol POSITIVE (*)    All other components within normal limits  TRIGLYCERIDES - Abnormal; Notable for the following components:   Triglycerides 175 (*)    All other components within normal limits  CK - Abnormal; Notable for the following components:   Total CK 1,173 (*)    All other components within normal limits  PHOSPHORUS - Abnormal; Notable for the following components:   Phosphorus 5.0 (*)    All other components within normal limits  LACTIC ACID, PLASMA - Abnormal; Notable for the following components:   Lactic Acid, Venous 4.2 (*)    All other components within normal limits  PROTIME-INR - Abnormal; Notable for the following components:   Prothrombin Time 20.9 (*)    All other components within normal limits  OSMOLALITY - Abnormal; Notable for the following components:   Osmolality 306 (*)    All other components within normal limits  I-STAT TROPONIN, ED - Abnormal; Notable for the following components:   Troponin i, poc 2.48 (*)    All other components within normal limits  I-STAT CG4 LACTIC ACID, ED - Abnormal; Notable for the following components:   Lactic Acid, Venous 3.84 (*)    All other components within normal limits  I-STAT CHEM 8, ED - Abnormal; Notable for the following components:   Sodium 134 (*)    Potassium 7.1 (*)    BUN 34 (*)    Creatinine, Ser 3.40 (*)    Glucose, Bld 107 (*)    Calcium, Ion 0.98 (*)    All other components within normal limits  I-STAT CG4 LACTIC ACID, ED - Abnormal; Notable for the following components:   Lactic Acid,  Venous 3.70 (*)    All other components within normal limits  POCT I-STAT 3, VENOUS BLOOD GAS (G3P V) - Abnormal; Notable for the following components:   pH, Ven 7.144 (*)    Bicarbonate 19.7 (*)    TCO2 21 (*)    Acid-base deficit 9.0 (*)    All other components within normal limits  CULTURE, BLOOD (ROUTINE X 2)  CULTURE, BLOOD (ROUTINE X 2)  CULTURE, RESPIRATORY  LIPASE, BLOOD  ETHANOL  SALICYLATE LEVEL  TSH  MAGNESIUM  SODIUM, URINE, RANDOM  CREATININE, URINE, RANDOM  BASIC METABOLIC PANEL  BLOOD GAS, ARTERIAL  AMMONIA  MAGNESIUM   ____________________________________________  EKG   EKG Interpretation  Date/Time:  Sunday July 16 2018 13:19:43 EDT Ventricular Rate:  125 PR Interval:    QRS Duration: 103 QT Interval:  298 QTC Calculation: 430 R Axis:   107 Text Interpretation:  Sinus tachycardia Atrial premature complex Borderline right axis deviation Minimal ST depression, inferior leads No STEMI.  Confirmed by Nanda Quinton [phone]) on 07/16/2018 3:29:36 PM       ____________________________________________  RADIOLOGY  Ct Head Wo Contrast  Result Date: 07/16/2018 CLINICAL DATA:  nasal 1m of narcan after finding empty hydrocodone bottle.not sure how many were left. Scheduled for surgery tomorrow to fix fracture. On arrival to ED unresponsive with no gag and hypotensive. EXAM: CT HEAD WITHOUT CONTRAST TECHNIQUE: Contiguous axial images were obtained from the base of the skull through the vertex without intravenous contrast. COMPARISON:  None. FINDINGS: Brain: No evidence of acute infarction, hemorrhage, hydrocephalus, extra-axial collection or mass lesion/mass effect. Motion artifact on scans through the skull base. Vascular: No hyperdense vessel or unexpected calcification. Skull: Normal. Negative for fracture or focal lesion. Sinuses/Orbits: No acute finding. Other: None IMPRESSION: Negative Electronically Signed   By: DLucrezia EuropeM.D.   On: 07/16/2018 14:42   Ct  Angio Chest Pe W And/or Wo Contrast  Result Date: 07/16/2018 CLINICAL DATA:  Unresponsive. Clinical suspicion for pulmonary embolus. EXAM: CT ANGIOGRAPHY CHEST WITH CONTRAST TECHNIQUE: Multidetector CT imaging of the chest was performed using the standard protocol during bolus administration of intravenous contrast. Multiplanar CT image reconstructions and MIPs were obtained to evaluate the vascular anatomy. CONTRAST:  102mISOVUE-370 IOPAMIDOL (ISOVUE-370) INJECTION 76% COMPARISON:  Heart size upper normal to mildly increased. No pericardial effusion. No thoracic aortic aneurysm. FINDINGS: Cardiovascular: No filling defects within the opacified pulmonary arteries to suggest the presence of an acute pulmonary embolus. Distal subsegmental arteries are obscured by breathing motion artifacts, hindering assessment. Mediastinum/Nodes: No mediastinal lymphadenopathy. There is no hilar lymphadenopathy. NG tube is seen in the esophagus. Endotracheal tube tip is in the distal carina. There is no axillary lymphadenopathy. Lungs/Pleura: Bibasilar dependent collapse/consolidation noted left, left greater than right. No pulmonary edema or pleural effusion. No pneumothorax. Upper Abdomen: Liver is enlarged with diffuse fatty deposition within the parenchyma. Musculoskeletal: No worrisome lytic or sclerotic osseous abnormality. Review of the MIP images confirms the above findings. IMPRESSION: 1. No CT evidence for acute pulmonary embolus. Distal subsegmental pulmonary arteries may not be reliably evaluated due to motion artifact. 2. Dependent collapse/consolidation in both lower lobes, left greater than right. 3. Hepatomegaly with hepatic steatosis. Electronically Signed   By: ErMisty Stanley.D.   On: 07/16/2018 14:45   Dg Chest Port 1 View  Result Date: 07/16/2018 CLINICAL DATA:  Central line placement EXAM: PORTABLE CHEST 1 VIEW COMPARISON:  Chest radiograph from earlier today. FINDINGS: Endotracheal tube tip is 2.8 cm  above the carina. Enteric tube enters stomach with the tip not seen on this image. Left internal jugular central venous catheter crosses the midline and terminates in region of the right subclavian vein. Stable cardiomediastinal silhouette with mild cardiomegaly. No pneumothorax. No pleural effusion. Bibasilar lung opacities, unchanged, favor atelectasis. No overt pulmonary edema. Low lung volumes. IMPRESSION: 1. Left internal jugular central venous catheter crosses the midline and terminates in the region of the right subclavian vein. No pneumothorax. 2. Well-positioned endotracheal and enteric tubes. 3. Stable low lung volumes and bibasilar lung opacities  favoring atelectasis. 4. Stable mild cardiomegaly without overt pulmonary edema. These results were called by telephone at the time of interpretation on 07/16/2018 at 3:41 pm to Dr. Laverta Baltimore in the ED, who verbally acknowledged these results. Electronically Signed   By: Ilona Sorrel M.D.   On: 07/16/2018 15:45   Dg Chest Portable 1 View  Result Date: 07/16/2018 CLINICAL DATA:  Post intubation EXAM: PORTABLE CHEST 1 VIEW COMPARISON:  Portable exam 1332 hours compared to 928 1,019 FINDINGS: Tip of endotracheal tube projects 3.6 cm above carina. Nasogastric tube extends into stomach. Enlargement of cardiac silhouette. Prominent mediastinum likely accentuated by portable supine technique. Bibasilar atelectasis. Questionable LEFT perihilar infiltrate. No pleural effusion or pneumothorax. IMPRESSION: Tip of endotracheal tube projects 3.6 cm above carina. Bibasilar atelectasis and questionable LEFT perihilar infiltrate. Enlargement of cardiac silhouette. Electronically Signed   By: Lavonia Dana M.D.   On: 07/16/2018 13:58    ____________________________________________   PROCEDURES  Procedure(s) performed:   .Critical Care Performed by: Margette Fast, MD Authorized by: Margette Fast, MD   Critical care provider statement:    Critical care time (minutes):   75   Critical care time was exclusive of:  Separately billable procedures and treating other patients and teaching time   Critical care was necessary to treat or prevent imminent or life-threatening deterioration of the following conditions:  Respiratory failure, circulatory failure and CNS failure or compromise   Critical care was time spent personally by me on the following activities:  Blood draw for specimens, development of treatment plan with patient or surrogate, discussions with consultants, evaluation of patient's response to treatment, examination of patient, obtaining history from patient or surrogate, ordering and performing treatments and interventions, ordering and review of laboratory studies, ordering and review of radiographic studies, pulse oximetry, re-evaluation of patient's condition, review of old charts and ventilator management   I assumed direction of critical care for this patient from another provider in my specialty: no   Procedure Name: Intubation Date/Time: 07/16/2018 3:30 PM Performed by: Margette Fast, MD Pre-anesthesia Checklist: Patient identified, Emergency Drugs available, Suction available, Patient being monitored and Timeout performed Oxygen Delivery Method: Non-rebreather mask Preoxygenation: Pre-oxygenation with 100% oxygen Induction Type: Rapid sequence Laryngoscope Size: Glidescope and 4 Grade View: Grade I Tube size: 8.0 mm Number of attempts: 1 Airway Equipment and Method: Video-laryngoscopy Placement Confirmation: ETT inserted through vocal cords under direct vision,  Positive ETCO2,  CO2 detector and Breath sounds checked- equal and bilateral Secured at: 23 cm Tube secured with: ETT holder Dental Injury: Teeth and Oropharynx as per pre-operative assessment       ____________________________________________   INITIAL IMPRESSION / ASSESSMENT AND PLAN / ED COURSE  Pertinent labs & imaging results that were available during my care of the  patient were reviewed by me and considered in my medical decision making (see chart for details).  Patient arrived to the emergency department in critical condition.  Not responding to Narcan in the field.  2 mg of Narcan given in the emergency department with no improvement.  Patient found to be tachycardic, hypoxic, hypotensive.  Elected to intubate after short interval of resuscitation to improve his hemodynamic status prior to intubation. Patient was started on Levophed. No clear seizure activity on exam.  Intubation performed without complication.  Initial labs showing renal failure with hyperkalemia along with elevated troponin.  Labs seem most consistent with multisystem organ failure from poor perfusion.  My suspicion for PE is elevated given his recent  leg/foot fracture, tachycardia, hypotension, hypoxemia.  Despite his elevated creatinine I discussed the case with the radiologist and decided to perform a CT Angio of the chest despite renal failure.   4:58 PM CTA negative for PE. Infiltrate at the bilateral lung bases. Liver enzymes are significantly elevated. No fever here.  Starting antibiotics and will shift potassium with insulin and D50.  I updated the family regarding his critical condition and plan to admit to the critical care service.   Discussed patient's case with Intensivist to request admission. Patient and family (if present) updated with plan. Care transferred to ICU service.  I reviewed all nursing notes, vitals, pertinent old records, EKGs, labs, imaging (as available).  ____________________________________________  FINAL CLINICAL IMPRESSION(S) / ED DIAGNOSES  Final diagnoses:  Acute respiratory failure with hypoxia ([facility])  Acute renal failure, unspecified acute renal failure type ([place])  Hyperkalemia     MEDICATIONS GIVEN DURING THIS VISIT:  Medications  naloxone (NARCAN) 2 MG/2ML injection (has no administration in time range)  propofol (DIPRIVAN) 10 mg/mL  bolus/IV push (has no administration in time range)  propofol (DIPRIVAN) 1000 MG/100ML infusion (has no administration in time range)  vasopressin (PITRESSIN) 40 Units in sodium chloride 0.9 % 250 mL (0.16 Units/mL) infusion (0.03 Units/min Intravenous New Bag/Given 07/16/18 1517)  aztreonam (AZACTAM) 2 g in sodium chloride 0.9 % 100 mL IVPB (has no administration in time range)  vancomycin (VANCOCIN) 1,750 mg in sodium chloride 0.9 % 500 mL IVPB (1,750 mg Intravenous New Bag/Given 07/16/18 1644)  vancomycin (VANCOCIN) 1,250 mg in sodium chloride 0.9 % 250 mL IVPB (has no administration in time range)  aztreonam (AZACTAM) 1 g in sodium chloride 0.9 % 100 mL IVPB (has no administration in time range)  fentaNYL (SUBLIMAZE) injection 50 mcg (has no administration in time range)  midazolam (VERSED) injection 2 mg (has no administration in time range)  docusate (COLACE) 50 MG/5ML liquid 100 mg (has no administration in time range)  bisacodyl (DULCOLAX) suppository 10 mg (has no administration in time range)  chlorhexidine gluconate (MEDLINE KIT) (PERIDEX) 0.12 % solution 15 mL (has no administration in time range)  MEDLINE mouth rinse (15 mLs Mouth Rinse Given 07/16/18 1651)  pantoprazole sodium (PROTONIX) 40 mg/20 mL oral suspension 40 mg (has no administration in time range)  heparin injection 5,000 Units (has no administration in time range)  0.9 %  sodium chloride infusion ( Intravenous Rate/Dose Change 07/16/18 1630)  norepinephrine (LEVOPHED) 22m in D5W 25756mpremix infusion (21.333 mcg/min Intravenous New Bag/Given 07/16/18 1634)  sodium chloride 0.9 % bolus 1,000 mL (1,000 mLs Intravenous New Bag/Given 07/16/18 1341)  norepinephrine (LEVOPHED) 56m68mn D5W 250m76memix infusion (5 mcg/min Intravenous New Bag/Given 07/16/18 1344)  etomidate (AMIDATE) injection (20 mg Intravenous Given 07/16/18 1335)  succinylcholine (ANECTINE) injection (100 mg Intravenous Given 07/16/18 1335)  iopamidol (ISOVUE-370) 76  % injection 100 mL (100 mLs Intravenous Contrast Given 07/16/18 1415)  insulin aspart (novoLOG) injection 10 Units (10 Units Intravenous Given 07/16/18 1514)  dextrose 50 % solution 50 mL (50 mLs Intravenous Given 07/16/18 1513)  calcium gluconate 1 g in sodium chloride 0.9 % 100 mL IVPB (1 g Intravenous New Bag/Given 07/16/18 1514)    Note:  This document was prepared using Dragon voice recognition software and may include unintentional dictation errors.  JoshNanda Quinton Emergency Medicine    Long, JoshWonda Olds 07/16/18 [phone]

## 2018-07-16 NOTE — Progress Notes (Signed)
Pharmacy Antibiotic Note  Randy Mcintyre is a 22 y.o. male admitted on 07/16/2018 after being found unresponsive. Pharmacy has been consulted for vancomycin and aztreonam dosing for PNA.  Patient has AKI with CrCL ~38 ml/min.  Hypothermic, WBC 18.5, LA 2.48.   Plan: Vanc 1750mg  IV x 1, then 1250mg  IV Q24H Azactam 2gm IV x 1, then 1gm IV Q8H Monitor renal fxn, clinical progress, vanc trough at Css     Temp (24hrs), Avg:95.2 F (35.1 C), Min:95.2 F (35.1 C), Max:95.2 F (35.1 C)  Recent Labs  Lab 07/16/18 1333 07/16/18 1338  WBC 18.5*  --   CREATININE 3.24* 3.40*  LATICACIDVEN  --  3.84*    Estimated Creatinine Clearance: 37.9 mL/min (A) (by C-G formula based on SCr of 3.4 mg/dL (H)).    Allergies  Allergen Reactions  . Penicillins Other (See Comments)    Severe mouth ulcers Has patient had a PCN reaction causing immediate rash, facial/tongue/throat swelling, SOB or lightheadedness with hypotension: No HAS PT DEVELOPED SEVERE RASH INVOLVING MUCUS MEMBRANES or SKIN NECROSIS: #  #  YES  #  #  >> SEVERE ORAL ULCERS. Has patient had a PCN reaction that required hospitalization: No Has patient had a PCN reaction occurring within the last 10 years: Yes If all of the above answers are "NO", then may proceed with Cephalosporin use.    Vanc 10/6 >> Azactam 10/6 >>  10/6 BCx -    Randy Mcintyre D. Laney Potash, PharmD, BCPS, BCCCP 07/16/2018, 2:34 PM

## 2018-07-16 NOTE — Consult Note (Signed)
Neurology Consultation Reason for Consult: Unresponsiveness Referring Physician: Clayton Lefort  CC: unresponsiveness  History is obtained from: Patient  HPI: Randy Mcintyre is a 22 y.o. male who broke his leg after falling out of a  [place]er [place] about a week ago.  Apparently his roommate woke him up to see if he needed anything, he did say something but is not clear if he was normal at this time.  He was then checked on around 10:30 AM.  EMS was called and found him to be tachycardic and hypoxic.  They gave him 2 mg of Narcan with no improvement and therefore he was brought into the emergency department.  He was found to have an empty bottle of oxycodone.   ROS:  Unable to obtain due to altered mental status.   Past Medical History:  Diagnosis Date  . Anxiety   . Complication of anesthesia 07/08/2018   pation had a reducation and splitting in ED- "they said that it took 3 times the amount it should have."  . Medical history non-contributory      No family history on file.   Social History:  reports that he has been smoking. He has smoked for the past 7.00 years. His smokeless tobacco use includes chew. He reports that he drinks about 24.0 standard drinks of alcohol per week. He reports that he has current or past drug history. Drug: Marijuana.   Exam: Current vital signs: BP (!) 93/44   Pulse (!) 109   Temp (!) 95.2 F (35.1 C) (Temporal)   Resp (!) 26   SpO2 98%  Vital signs in last 24 hours: Temp:  [95.2 F (35.1 C)] 95.2 F (35.1 C) (10/06 1318) Pulse Rate:  [109-126] 109 (10/06 1510) Resp:  [19-32] 26 (10/06 1510) BP: (72-98)/(44-57) 93/44 (10/06 1510) SpO2:  [80 %-98 %] 98 % (10/06 1620) FiO2 (%):  [100 %] 100 % (10/06 1620)   Physical Exam  Constitutional: Appears well-developed and well-nourished.  Psych: Does not respond Eyes: No scleral injection HENT: Intubated Head: Normocephalic.  Cardiovascular: Normal rate and regular rhythm.  Respiratory: Ventilated GI:  Soft.  No distension. There is no tenderness.  Skin: WDI  Neuro: Mental Status: Patient is severely obtunded, with deep noxious stimulation, he does partially open his eyes but does not follow commands. Cranial Nerves: II: Does not blink to threat.  Right pupil 3 mm and reactive, left pupil 4 mm and reactive III,IV, VI: Eyes are slightly disconjugate, right is outwardly deviated compared to left but both move with doll's eye maneuver V: VII: Corneals are intact X: Cough is intact  Motor: He appears to try to push me away slightly when I apply noxious stimulation to the medial aspect of his arms.  Minimal withdrawal in his right leg, possible flicker in his left leg  Sensory: He does respond to noxious stimulation in all 4 extremities Cerebellar: Does not perform  I have reviewed labs in epic and the results pertinent to this consultation are: CMP-elevated creatinine, elevated potassium, elevated LFTs CK-1100  I have reviewed the images obtained: CT head- unremarkable  Impression: 22 year old male with unresponsiveness, likely secondary to drug overdose/metabolic encephalopathy.  Neurology has been consulted to facilitate EEG to rule out seizure as an etiology.  Recommendations: 1) agree with stat EEG, I have ordered this.  2) if this does not reveal ongoing seizure or epileptic tendency then will defer to the medical team for treatment of his underlying medical issues.  We will be available  on an as-needed basis.   Ritta Slot, MD [facility] [phone]  If 7pm- 7am, please page neurology on call as listed in AMION.

## 2018-07-16 NOTE — Progress Notes (Signed)
eLink Physician-Brief Progress Note Patient Name: Randy Mcintyre DOB: 01/08/1996 MRN: [id number]   Date of Service  07/16/2018  HPI/Events of Note  K+ = 6.5 at 4:46 PM. QRS on bedside monitor not widened. T wave not peaked.   eICU Interventions  Will order: 1. Kayexalate 30 gm per tube now.  2. CMP ordered for AM.      Intervention Category Major Interventions: Electrolyte abnormality - evaluation and management  Yohance Hathorne [place] 07/16/2018, 9:01 PM

## 2018-07-16 NOTE — ED Notes (Signed)
Patient transported to CT 

## 2018-07-17 ENCOUNTER — Inpatient Hospital Stay (HOSPITAL_COMMUNITY): Payer: Medicaid Other

## 2018-07-17 ENCOUNTER — Ambulatory Visit (HOSPITAL_COMMUNITY): Admission: RE | Admit: 2018-07-17 | Payer: Medicaid Other | Source: Ambulatory Visit | Admitting: Orthopaedic Surgery

## 2018-07-17 ENCOUNTER — Encounter (HOSPITAL_COMMUNITY)
Admission: EM | Disposition: A | Discharge status: Home / Self Care | Payer: Self-pay | Source: Home / Self Care | Attending: Internal Medicine

## 2018-07-17 DIAGNOSIS — R4182 Altered mental status, unspecified: Secondary | ICD-10-CM

## 2018-07-17 DIAGNOSIS — I5030 Unspecified diastolic (congestive) heart failure: Secondary | ICD-10-CM

## 2018-07-17 DIAGNOSIS — J9600 Acute respiratory failure, unspecified whether with hypoxia or hypercapnia: Secondary | ICD-10-CM

## 2018-07-17 HISTORY — DX: Adverse effect of unspecified anesthetic, initial encounter: T41.45XA

## 2018-07-17 HISTORY — DX: Anxiety disorder, unspecified: F41.9

## 2018-07-17 HISTORY — DX: Other specified health status: Z78.9

## 2018-07-17 LAB — COMPREHENSIVE METABOLIC PANEL
ALT: 4776 U/L — ABNORMAL HIGH (ref 0–44)
ANION GAP: 6 (ref 5–15)
AST: 9812 U/L — ABNORMAL HIGH (ref 15–41)
Albumin: 2.5 g/dL — ABNORMAL LOW (ref 3.5–5.0)
Alkaline Phosphatase: 43 U/L (ref 38–126)
BUN: 25 mg/dL — ABNORMAL HIGH (ref 6–20)
CALCIUM: 7.4 mg/dL — AB (ref 8.9–10.3)
CHLORIDE: 106 mmol/L (ref 98–111)
CO2: 25 mmol/L (ref 22–32)
Creatinine, Ser: 1.65 mg/dL — ABNORMAL HIGH (ref 0.61–1.24)
GFR calc non Af Amer: 57 mL/min — ABNORMAL LOW (ref >60)
Glucose, Bld: 144 mg/dL — ABNORMAL HIGH (ref 70–99)
Potassium: 3.4 mmol/L — ABNORMAL LOW (ref 3.5–5.1)
SODIUM: 137 mmol/L (ref 135–145)
Total Bilirubin: 1.2 mg/dL (ref 0.3–1.2)
Total Protein: 4.8 g/dL — ABNORMAL LOW (ref 6.5–8.1)

## 2018-07-17 LAB — DIC (DISSEMINATED INTRAVASCULAR COAGULATION)PANEL
Smear Review: NONE SEEN
aPTT: 37 seconds — ABNORMAL HIGH (ref 24–36)

## 2018-07-17 LAB — HEPATIC FUNCTION PANEL
ALK PHOS: 41 U/L (ref 38–126)
ALT: 4628 U/L — AB (ref 0–44)
AST: 9888 U/L — AB (ref 15–41)
Albumin: 2.5 g/dL — ABNORMAL LOW (ref 3.5–5.0)
BILIRUBIN DIRECT: 0.3 mg/dL — AB (ref 0.0–0.2)
Indirect Bilirubin: 1 mg/dL — ABNORMAL HIGH (ref 0.3–0.9)
Total Bilirubin: 1.3 mg/dL — ABNORMAL HIGH (ref 0.3–1.2)
Total Protein: 4.7 g/dL — ABNORMAL LOW (ref 6.5–8.1)

## 2018-07-17 LAB — BLOOD GAS, ARTERIAL
ACID-BASE DEFICIT: 0 mmol/L (ref 0.0–2.0)
BICARBONATE: 23.9 mmol/L (ref 20.0–28.0)
Drawn by: 517021
FIO2: 100
LHR: 26 {breaths}/min
O2 Saturation: 98.2 %
PEEP/CPAP: 5 cmH2O
PO2 ART: 112 mmHg — AB (ref 83.0–108.0)
Patient temperature: 98.6
VT: 570 mL
pCO2 arterial: 37.9 mmHg (ref 32.0–48.0)
pH, Arterial: 7.417 (ref 7.350–7.450)

## 2018-07-17 LAB — GLUCOSE, CAPILLARY
GLUCOSE-CAPILLARY: 123 mg/dL — AB (ref 70–99)
GLUCOSE-CAPILLARY: 130 mg/dL — AB (ref 70–99)
GLUCOSE-CAPILLARY: 149 mg/dL — AB (ref 70–99)
Glucose-Capillary: 161 mg/dL — ABNORMAL HIGH (ref 70–99)
Glucose-Capillary: 113 mg/dL — ABNORMAL HIGH (ref 70–99)
Glucose-Capillary: 120 mg/dL — ABNORMAL HIGH (ref 70–99)

## 2018-07-17 LAB — DIC (DISSEMINATED INTRAVASCULAR COAGULATION) PANEL
FIBRINOGEN: 244 mg/dL (ref 210–475)
INR: 1.87
PLATELETS: 53 10*3/uL — AB (ref 150–400)
PROTHROMBIN TIME: 21.3 s — AB (ref 11.4–15.2)

## 2018-07-17 LAB — CBC
HEMATOCRIT: 32.9 % — AB (ref 39.0–52.0)
Hemoglobin: 10.5 g/dL — ABNORMAL LOW (ref 13.0–17.0)
MCH: 29.9 pg (ref 26.0–34.0)
MCHC: 31.9 g/dL (ref 30.0–36.0)
MCV: 93.7 fL (ref 78.0–100.0)
Platelets: 53 10*3/uL — ABNORMAL LOW (ref 150–400)
RBC: 3.51 MIL/uL — ABNORMAL LOW (ref 4.22–5.81)
RDW: 12.6 % (ref 11.5–15.5)
WBC: 6.7 10*3/uL (ref 4.0–10.5)

## 2018-07-17 LAB — BASIC METABOLIC PANEL
ANION GAP: 7 (ref 5–15)
BUN: 27 mg/dL — ABNORMAL HIGH (ref 6–20)
CHLORIDE: 107 mmol/L (ref 98–111)
CO2: 23 mmol/L (ref 22–32)
Calcium: 7.2 mg/dL — ABNORMAL LOW (ref 8.9–10.3)
Creatinine, Ser: 2 mg/dL — ABNORMAL HIGH (ref 0.61–1.24)
GFR calc Af Amer: 53 mL/min — ABNORMAL LOW (ref >60)
GFR calc non Af Amer: 45 mL/min — ABNORMAL LOW (ref >60)
GLUCOSE: 218 mg/dL — AB (ref 70–99)
POTASSIUM: 3.8 mmol/L (ref 3.5–5.1)
Sodium: 137 mmol/L (ref 135–145)

## 2018-07-17 LAB — ACETAMINOPHEN LEVEL: Acetaminophen (Tylenol), Serum: <10 ug/mL — ABNORMAL LOW (ref 10–30)

## 2018-07-17 LAB — ECHOCARDIOGRAM COMPLETE
Height: 69.5 in
Weight: 3199.32 oz

## 2018-07-17 LAB — PHOSPHORUS: PHOSPHORUS: 2.5 mg/dL (ref 2.5–4.6)

## 2018-07-17 LAB — CK: CK TOTAL: 2820 U/L — AB (ref 49–397)

## 2018-07-17 LAB — MAGNESIUM: Magnesium: 2.1 mg/dL (ref 1.7–2.4)

## 2018-07-17 SURGERY — OPEN REDUCTION INTERNAL FIXATION (ORIF) FOOT LISFRANC FRACTURE
Anesthesia: Choice | Site: Foot | Laterality: Left

## 2018-07-17 MED ORDER — VANCOMYCIN HCL IN DEXTROSE 1-5 GM/200ML-% IV SOLN
1000.0000 mg | Freq: Two times a day (BID) | INTRAVENOUS | Status: DC
  Administered 2018-07-17 (×2): 1000 mg via INTRAVENOUS
  Filled 2018-07-17 (×3): qty 200

## 2018-07-17 MED ORDER — IBUPROFEN 100 MG/5ML PO SUSP
600.0000 mg | Freq: Once | ORAL | Status: AC
  Administered 2018-07-17: 600 mg
  Filled 2018-07-17: qty 30

## 2018-07-17 MED ORDER — LACTATED RINGERS IV SOLN
INTRAVENOUS | Status: DC
  Administered 2018-07-17: 15:00:00 via INTRAVENOUS

## 2018-07-17 MED ORDER — CHLORHEXIDINE GLUCONATE CLOTH 2 % EX PADS
6.0000 | MEDICATED_PAD | Freq: Every day | CUTANEOUS | Status: DC
  Administered 2018-07-17 – 2018-07-20 (×4): 6 via TOPICAL

## 2018-07-17 MED ORDER — VITAL 1.5 CAL PO LIQD
1000.0000 mL | ORAL | Status: DC
  Administered 2018-07-17 – 2018-07-18 (×2): 1000 mL
  Filled 2018-07-17 (×5): qty 1000

## 2018-07-17 NOTE — Progress Notes (Signed)
*  PRELIMINARY RESULTS* Echocardiogram 2D Echocardiogram has been performed.  Sheralyn Boatman R 07/17/2018, 11:04 AM

## 2018-07-17 NOTE — Progress Notes (Addendum)
NAME:  Antoni Stefan, MRN:  [id number], DOB:  02-07-1996, LOS: 1 ADMISSION DATE:  07/16/2018, CONSULTATION DATE:  07/16/2018 REFERRING MD:  EDP - Long, CHIEF COMPLAINT:  Obtunded   Brief History   22 year old previously active male who fell from a deerstand and fractured his right foot.  Patient was given mobic, norco and neurontin.  Patient was lethargic this AM and was left to rest.  3 hours later was found unresponsive and EMS was called.  Narcan was given without effect.  Patient was brought to the ER where he was intubated and a pill fragment was found in the back of his throat.  PCCM was called to admit.  CT of the chest negative for PE and head CT was negative.  Past Medical History  None Significant Hospital Events   10/6 admission for respiratory failure, renal failure and shocked liver  Consults: date of consult/date signed off & final recs:  Neurology  Procedures (surgical and bedside):  ETT 10/6>>> L IJ TLC 10/6>>>  Significant Diagnostic Tests:  Head CT 10/6>>>Negative Chest CT 10/6>>> negative for PE but there is LLL infiltrate EEG 10/6 >> no evidence active seizures, consistent with metabolic encephalopathy  Micro Data:  Blood 10/6>>> Urine 10/6>>> Sputum 10/6>>> GNR, GPR >>   Antimicrobials:  Vanc 10/6>>> Aztreonam 10/6>>>   Subjective:  Pressures weaned off overnight No current sedation  Objective   Blood pressure 113/70, pulse 100, temperature (!) 97.5 F (36.4 C), temperature source Axillary, resp. rate (!) 26, height 5' 9.5" (1.765 m), weight 90.7 kg, SpO2 98 %. CVP:  [4 mmHg-24 mmHg] 20 mmHg  Vent Mode: PRVC FiO2 (%):  [100 %] 100 % Set Rate:  [20 bmp-36 bmp] 26 bmp Vt Set:  [570 mL] 570 mL PEEP:  [5 cmH20-10 cmH20] 5 cmH20 Plateau Pressure:  [21 cmH20-25 cmH20] 21 cmH20   Intake/Output Summary (Last 24 hours) at 07/17/2018 1155 Last data filed at 07/17/2018 0900 Gross per 24 hour  Intake 4274.21 ml  Output 3830 ml  Net 444.21 ml   Filed Weights     07/17/18 0936  Weight: 90.7 kg    Examination: General: Well-developed, young man, intubated and poorly responsive HENT: ET tube in place, no oral lesions or secretions, pupils equal Lungs: Clear bilaterally, decreased both bases Cardiovascular: Better, no murmur Abdomen: Soft, nondistended, positive bowel sounds Extremities: No edema Neuro: He moved his bilateral upper extremities with painful stimulus, did not open eyes, does not follow commands Skin: Scattered bruises  Resolved Hospital Problem list   None  Assessment & Plan:  22 year old male with no significant PMH other than the lower extremity fracture presented with acute encephalopathy and acute respiratory failure.  Required ventilation for airway protection.  He has acute renal dysfunction, transaminitis suspected to be shock liver.  He is hemodynamically improved but remains encephalopathic  VDRF due to airway protection, encephalopathy: Continue full ventilator support Plan for spontaneous breathing trials when his mental status will allow VAP prevention orders Pulmonary hygiene  Circulatory shock: septic vs hypovolemic, resolved Decrease IV fluids, change to LR Discontinue norepinephrine, vasopressin Follow lactic acid for clearance  Left lower lobe aspiration pneumonia: Vancomycin, aztreonam given penicillin allergy Cultures pending  Acute renal failure: Appreciate nephrology assistance Serum creatinine improving with aggressive IV fluids, plan to decrease on 10/7  Hyperkalemia, resolved Follow BMP, urine output Discontinue bicarbonate gtt  Transaminitis, suspect shock liver but concern for possible chronic changes given his thrombocytopenia, coagulopathy Stat Tylenol level Follow coags  and LFT Check hepatitis panel and right upper quadrant ultrasound  Coagulopathy, presumed hepatic Follow LFT, INR  Acute encephalopathy, toxic metabolic EEG performed 10/6 was reassuring Follow mental status with  correction of his underlying metabolic injuries.  If he remains encephalopathic even after renal and hepatic recovery then we will need to consider further imaging/testing, neurology consultation  Recent left lower extremity trauma and fracture Appreciate orthopedics assistance  Family/ GOC  Discussed status with parents, fiancee at bedside 10/7  Disposition / Summary of Today's Plan 07/17/18     Labs   CBC: Recent Labs  Lab 07/16/18 1333 07/16/18 1338 07/17/18 0451 07/17/18 0545  WBC 18.5*  --  6.7  --   NEUTROABS 15.1*  --   --   --   HGB 12.9* 13.6 10.5*  --   HCT 41.3 40.0 32.9*  --   MCV 95.6  --  93.7  --   PLT 114*  --  53* 53*    Basic Metabolic Panel: Recent Labs  Lab 07/16/18 1333 07/16/18 1338 07/16/18 1535 07/16/18 1646 07/16/18 2007 07/17/18 0002 07/17/18 0451  NA 136 134*  --  132* 129* 137 137  K 7.3* 7.1*  --  6.5* 5.2* 3.8 3.4*  CL 102 104  --  107 104 107 106  CO2 20*  --   --  18* 18* 23 25  GLUCOSE 113* 107*  --  258* 255* 218* 144*  BUN 27* 34*  --  29* 30* 27* 25*  CREATININE 3.24* 3.40*  --  2.68* 2.41* 2.00* 1.65*  CALCIUM 8.1*  --   --  6.7* 6.9* 7.2* 7.4*  MG  --   --  2.0 2.0  --   --  2.1  PHOS  --   --  5.0*  --   --   --  2.5   GFR: Estimated Creatinine Clearance: 78.2 mL/min (A) (by C-G formula based on SCr of 1.65 mg/dL (H)). Recent Labs  Lab 07/16/18 1333 07/16/18 1338 07/16/18 1525 07/16/18 1535 07/17/18 0451  WBC 18.5*  --   --   --  6.7  LATICACIDVEN  --  3.84* 3.70* 4.2*  --     Liver Function Tests: Recent Labs  Lab 07/16/18 1333 07/17/18 0002 07/17/18 0451  AST 2,640* 9,888* 9,812*  ALT 3,096* 4,628* 4,776*  ALKPHOS 56 41 43  BILITOT 1.1 1.3* 1.2  PROT 6.2* 4.7* 4.8*  ALBUMIN 3.4* 2.5* 2.5*   Recent Labs  Lab 07/16/18 1333  LIPASE 34   Recent Labs  Lab 07/16/18 1646  AMMONIA 47*    ABG    Component Value Date/Time   PHART 7.417 07/17/2018 0330   PCO2ART 37.9 07/17/2018 0330   PO2ART 112  (H) 07/17/2018 0330   HCO3 23.9 07/17/2018 0330   TCO2 19 (L) 07/16/2018 2028   ACIDBASEDEF 0.0 07/17/2018 0330   O2SAT 98.2 07/17/2018 0330     Coagulation Profile: Recent Labs  Lab 07/16/18 1535 07/17/18 0545  INR 1.82 1.87    Cardiac Enzymes: Recent Labs  Lab 07/16/18 1535 07/17/18 0451  CKTOTAL 1,173* 2,820*    HbA1C: No results found for: HGBA1C  CBG: Recent Labs  Lab 07/16/18 1955 07/16/18 2351 07/17/18 0331 07/17/18 0737 07/17/18 1122  GLUCAP 256* 213* 161* 123* 113*   Independent CC time 34 minutes  Levy Pupa, MD, PhD 07/17/2018, 2:02 PM [facility] [phone] or if no answer [phone]

## 2018-07-17 NOTE — Consult Note (Signed)
Randy Mcintyre is an 22 y.o. male.   HPI: Patient fell from a deer stand 1 week ago.  Approximately 20 feet up.  He sustained a left foot fracture and was initially seen in the emergency department and prescribed meloxicam, gabapentin and Norco.  I saw him in clinic last week and scheduled him for 07-17-2018.  He was admitted to the hospital yesterday with presumed drug overdose.  On evaluation today the patient is intubated and unresponsive to verbal and tactile stimuli.  I did get a chance to discuss his condition with the patient's mother and his fianc.  Randy Mcintyre states that over the past few days the patient has not complained of much pain in his left foot.  She has been regulating his medication dosages that were prescribed.  She states on both Saturday night and Sunday morning he was not complaining of pain in his foot and did not request any narcotic pain medication.  Early on Sunday morning she woke him and he was a bit groggy but again did not complain of pain and therefore she gave him no medication.  She then got out of bed and began making breakfast.  Proximally 3 hours later she went back into the room and at that time she noted that the patient's lips were purple and that he was unresponsive.  There was fluid coming from his eyes and mouth.  She shook him vigorously and he was unresponsive and therefore called 911.  His mother states that a few days ago he was tearful at her house due to his foot fracture and inability to work and provide for his family.  Mother and fianc deny any history of illicit drug use.  Randy Mcintyre states as far as she knew he was taking the narcotic medication as prescribed.  They did voice concern given the positive UDS for cocaine, benzodiazepines as well as THC, that he may have been offered this by a friend with a presumed drug problem.    Past Medical History:  Diagnosis Date  . Anxiety   . Complication of anesthesia 07/08/2018   pation had a reducation and splitting in  ED- "they said that it took 3 times the amount it should have."  . Medical history non-contributory     History reviewed. No pertinent surgical history.  No family history on file.  Social History:  reports that he has been smoking. He has smoked for the past 7.00 years. His smokeless tobacco use includes chew. He reports that he drinks about 24.0 standard drinks of alcohol per week. He reports that he has current or past drug history. Drug: Marijuana.  Allergies:  Allergies  Allergen Reactions  . Penicillins Other (See Comments)    Severe mouth ulcers Has patient had a PCN reaction causing immediate rash, facial/tongue/throat swelling, SOB or lightheadedness with hypotension: No HAS PT DEVELOPED SEVERE RASH INVOLVING MUCUS MEMBRANES or SKIN NECROSIS:  YES SEVERE ORAL ULCERS. Has patient had a PCN reaction that required hospitalization: No Has patient had a PCN reaction occurring within the last 10 years: Yes If all of the above answers are "NO", then may proceed with Cephalosporin use.   Labs: I have reviewed the patient's most recent laboratory values.  Medications: I have reviewed the patient's current medications.    Review of Systems  Unable to perform ROS: Patient unresponsive   Blood pressure 118/67, pulse 99, temperature 99.7 F (37.6 C), resp. rate (!) 26, height 5' 9.5" (1.765 m), weight 90.7 kg, SpO2 98 %.  Physical Exam  Constitutional: He appears well-nourished.  HENT:  Head: Normocephalic.  Eyes: Right eye exhibits no discharge. Left eye exhibits no discharge.  Neck: Neck supple.  Cardiovascular: Normal rate.  Respiratory:  Intubated on ventilator  GI: He exhibits no distension.  Musculoskeletal:  LLE in splint.  Toes exposed are swollen.  No skin breakdown.  WWP.  Unable to assess motor or sensation due to unresponsiveness.  Neurological:  Unresponsive to verbal and tactile stimuli.  Skin: Skin is warm and dry.    Assessment/Plan: Randy Mcintyre has had an  unfortunate turn of events.  I discussed his case with the on-call intensivist last night and we canceled his surgery.  Depending on the patient's recovery we will plan to reschedule this when more appropriate.  For the time being would recommend continuing to elevate the left lower extremity and floating the heel.  Will defer the need for DVT prophylaxis to the intensivist team given his liver dysfunction and possible coagulopathy.  I will continue to follow along while in the hospital and monitor for overall clinical improvement.    Randy Mcintyre 07/17/2018, 9:57 AM

## 2018-07-17 NOTE — Progress Notes (Signed)
Pharmacy Antibiotic Note  Randy Mcintyre is a 22 y.o. male admitted on 07/16/2018 after being found unresponsive. Pharmacy has been consulted for vancomycin and aztreonam dosing for PNA.  Patient's AKI is resolving, with CrCL ~78 ml/min. Today patient is febrile (Tmax 100.2), lactic acid 4.2, but WBC improved 18.5 to 6.7.  Plan: Increase vancomycin to 1000mg  IV Q12H  Continue Azactam 1g IV Q8H Monitor renal fxn, clinical progress, vanc trough at Css  Height: 5' 9.5" (176.5 cm) Weight: 199 lb 15.3 oz (90.7 kg) IBW/kg (Calculated) : 71.85  Temp (24hrs), Avg:100.3 F (37.9 C), Min:97.5 F (36.4 C), Max:101.6 F (38.7 C)  Recent Labs  Lab 07/16/18 1333 07/16/18 1338 07/16/18 1525 07/16/18 1535 07/16/18 1646 07/16/18 2007 07/17/18 0002 07/17/18 0451  WBC 18.5*  --   --   --   --   --   --  6.7  CREATININE 3.24* 3.40*  --   --  2.68* 2.41* 2.00* 1.65*  LATICACIDVEN  --  3.84* 3.70* 4.2*  --   --   --   --     Estimated Creatinine Clearance: 78.2 mL/min (A) (by C-G formula based on SCr of 1.65 mg/dL (H)).    Allergies  Allergen Reactions  . Penicillins Other (See Comments)    Severe mouth ulcers Has patient had a PCN reaction causing immediate rash, facial/tongue/throat swelling, SOB or lightheadedness with hypotension: No HAS PT DEVELOPED SEVERE RASH INVOLVING MUCUS MEMBRANES or SKIN NECROSIS:  YES SEVERE ORAL ULCERS. Has patient had a PCN reaction that required hospitalization: No Has patient had a PCN reaction occurring within the last 10 years: Yes If all of the above answers are "NO", then may proceed with Cephalosporin use.   Antibiotics this admission: Vanc 10/6 >> Azactam 10/6 >>  Cultures 10/6 BCx -  10/6 MRSA PCR - positive 10/6 Trach Cx - Few GPC, Rare GNR and GPR  Thank you for allowing pharmacy to be a part of this patient's care.  Lenord Carbo, PharmD PGY1 Pharmacy Resident Phone: [phone]  Please check AMION for all [facility] phone  numbers 07/17/2018, 2:31 PM

## 2018-07-17 NOTE — Progress Notes (Signed)
eLink Physician-Brief Progress Note Patient Name: [place] Shellhammer DOB: 1996/02/07 MRN: [id number]   Date of Service  07/17/2018  HPI/Events of Note  Notified of need for DVT prophylaxis. Mild coagulopathy with INR = 1.87 and elevated transaminases. L foot fracture.   eICU Interventions  Will order: 1. Place SCD to R leg only.      Intervention Category Intermediate Interventions: Best-practice therapies (e.g. DVT, beta blocker, etc.)  Sommer,Steven Eugene 07/17/2018, 11:33 PM

## 2018-07-17 NOTE — Progress Notes (Signed)
Admit: 07/16/2018 LOS: 1  57M AKI likely from ATN/shock, UDS+ for cocaine, opiates, THC, BZDs. Transaminitis  Subjective:  Marland Kitchen SCr quickly improving, K 3.4  . Pressors weened off . On NaHCO3 gtt . 3.7L UOP . Rena [place] neg yesterday . Still with transaminitis  10/06 0701 - 10/07 0700 In: 4067 [I.V.:2226.9; NG/GT:240; IV Piggyback:1600.1] Out: 4765 [Urine:3730; Emesis/NG output:100]  Filed Weights   07/17/18 0936  Weight: 90.7 kg    Scheduled Meds: . chlorhexidine gluconate (MEDLINE KIT)  15 mL Mouth Rinse BID  . Chlorhexidine Gluconate Cloth  6 each Topical Q0600  . insulin aspart  1-3 Units Subcutaneous Q4H  . mouth rinse  15 mL Mouth Rinse 10 times per day  . mupirocin ointment  1 application Nasal BID  . pantoprazole sodium  40 mg Per Tube Q24H   Continuous Infusions: . aztreonam 1 g (07/17/18 0953)  . norepinephrine (LEVOPHED) Adult infusion Stopped (07/17/18 0215)  .  sodium bicarbonate (isotonic) infusion in sterile water 100 mL/hr at 07/17/18 0900  . vancomycin 200 mL/hr at 07/17/18 0900  . vasopressin (PITRESSIN) infusion - *FOR SHOCK* Stopped (07/17/18 0240)   PRN Meds:.acetaminophen (TYLENOL) oral liquid 160 mg/5 mL **OR** acetaminophen, bisacodyl, docusate, fentaNYL (SUBLIMAZE) injection, midazolam  Current Labs: reviewed    Physical Exam:  Blood pressure 112/73, pulse 100, temperature (!) 97.5 F (36.4 C), temperature source Axillary, resp. rate (!) 26, height 5' 9.5" (1.765 m), weight 90.7 kg, SpO2 98 %. Intubated Obtunded RRR Coarse BS b/l No sig LEE S/nt/nd R Foot bandaged  A 1. Resolving AKI likely from shock / hypovoelmia / NSAIDs 2. Hyperkalemia, resolved 3. Transaminitis 4. Recent R Foot Fracture 5. Polysubstance Abuse 6. Fevers  P . Recovering GFR, K normalized . No further suggestions . Is at risk of CIN given recent CTA . Will sign off for now, call back with any issues or concerns.    Pearson Grippe MD 07/17/2018, 11:32 AM  Recent  Labs  Lab 07/16/18 1535  07/16/18 2007 07/17/18 0002 07/17/18 0451  NA  --    < > 129* 137 137  K  --    < > 5.2* 3.8 3.4*  CL  --    < > 104 107 106  CO2  --    < > 18* 23 25  GLUCOSE  --    < > 255* 218* 144*  BUN  --    < > 30* 27* 25*  CREATININE  --    < > 2.41* 2.00* 1.65*  CALCIUM  --    < > 6.9* 7.2* 7.4*  PHOS 5.0*  --   --   --  2.5   < > = values in this interval not displayed.   Recent Labs  Lab 07/16/18 1333 07/16/18 1338 07/17/18 0451 07/17/18 0545  WBC 18.5*  --  6.7  --   NEUTROABS 15.1*  --   --   --   HGB 12.9* 13.6 10.5*  --   HCT 41.3 40.0 32.9*  --   MCV 95.6  --  93.7  --   PLT 114*  --  53* 53*

## 2018-07-17 NOTE — Progress Notes (Signed)
Initial Nutrition Assessment  DOCUMENTATION CODES:   Not applicable  INTERVENTION:   Vital AF 1.5 @ 65 mL/hr Provides 2340 kcal, 105 g protein, and 1326 mL free water. This meets 98% of energy needs and 96% of protein needs. Recommend additional 250 mL water flushes Q 6 hours.  Monitor for tolerance.  NUTRITION DIAGNOSIS:   Inadequate oral intake related to acute illness as evidenced by NPO status.  GOAL:   Patient will meet greater than or equal to 90% of their needs  MONITOR:   Labs, I & O's, TF tolerance, Vent status  REASON FOR ASSESSMENT:   Ventilator    ASSESSMENT:   22 yo male, admitted for respiratory failure. Suspected renal failure d/t dehydration and narcotic accumulation. No significant PMHx aside from recent foot fracture.    OGT in place.  Patient is currently intubated on ventilator support MV: 10.4 L/min Temp (24hrs), Avg:100.1 F (37.8 C), Min:95.2 F (35.1 C), Max:101.6 F (38.7 C)  Labs: potassium 3.4 and trending down, creatinine 1.65, BUN 25, Hgb 10.5, MCV 32.9 (L) Meds: norepinephrine, vasopressin  Per nsg, pt doing well today, starting to try to wake up. No propofol. Pt asleep at time of visit.   NUTRITION - FOCUSED PHYSICAL EXAM:    Most Recent Value  Orbital Region  No depletion  Upper Arm Region  No depletion  Thoracic and Lumbar Region  No depletion  Buccal Region  No depletion  Temple Region  No depletion  Clavicle Bone Region  No depletion  Clavicle and Acromion Bone Region  No depletion  Scapular Bone Region  No depletion  Dorsal Hand  No depletion  Patellar Region  No depletion  Anterior Thigh Region  No depletion  Posterior Calf Region  No depletion  Edema (RD Assessment)  None  Hair  Reviewed  Eyes  Unable to assess  Mouth  Unable to assess  Skin  Reviewed  Nails  Reviewed     Diet Order:   Diet Order            Diet NPO time specified  Diet effective now             EDUCATION NEEDS:   Not appropriate  for education at this time  Skin:  Skin Assessment: Reviewed RN Assessment  Last BM:  no BM  Height:   Ht Readings from Last 1 Encounters:  07/17/18 5' 9.5" (1.765 m)   Weight:   Wt Readings from Last 1 Encounters:  07/17/18 90.7 kg   Ideal Body Weight:  74.1 kg  BMI:  Body mass index is 29.11 kg/m.  Estimated Nutritional Needs:   Kcal:  2397 calories/day (PSU 2003b)  Protein:  109 g protein/day(1.2 g/kg ABW)  Fluid:  1 mL/kcal or per MD  Jolaine Artist, MS, RDN, LDN On-call pager: [phone]

## 2018-07-18 DIAGNOSIS — R740 Nonspecific elevation of levels of transaminase and lactic acid dehydrogenase [LDH]: Secondary | ICD-10-CM

## 2018-07-18 DIAGNOSIS — J9601 Acute respiratory failure with hypoxia: Secondary | ICD-10-CM

## 2018-07-18 DIAGNOSIS — R7401 Elevation of levels of liver transaminase levels: Secondary | ICD-10-CM

## 2018-07-18 DIAGNOSIS — N179 Acute kidney failure, unspecified: Secondary | ICD-10-CM

## 2018-07-18 LAB — BASIC METABOLIC PANEL
Anion gap: 8 (ref 5–15)
BUN: 15 mg/dL (ref 6–20)
CO2: 26 mmol/L (ref 22–32)
CREATININE: 1.02 mg/dL (ref 0.61–1.24)
Calcium: 8 mg/dL — ABNORMAL LOW (ref 8.9–10.3)
Chloride: 106 mmol/L (ref 98–111)
GFR calc Af Amer: >60 mL/min (ref >60)
GLUCOSE: 147 mg/dL — AB (ref 70–99)
POTASSIUM: 2.8 mmol/L — AB (ref 3.5–5.1)
Sodium: 140 mmol/L (ref 135–145)

## 2018-07-18 LAB — PROTIME-INR
INR: 1.76
Prothrombin Time: 20.4 seconds — ABNORMAL HIGH (ref 11.4–15.2)

## 2018-07-18 LAB — TROPONIN I
TROPONIN I: 6.41 ng/mL — AB (ref <0.03)
Troponin I: 8.5 ng/mL (ref <0.03)

## 2018-07-18 LAB — HEPATITIS PANEL, ACUTE
HEP A IGM: NEGATIVE
HEP B S AG: NEGATIVE
Hep B C IgM: NEGATIVE

## 2018-07-18 LAB — HEPATIC FUNCTION PANEL
ALBUMIN: 2.3 g/dL — AB (ref 3.5–5.0)
ALT: 5262 U/L — AB (ref 0–44)
AST: 5005 U/L — AB (ref 15–41)
Alkaline Phosphatase: 48 U/L (ref 38–126)
Bilirubin, Direct: 0.5 mg/dL — ABNORMAL HIGH (ref 0.0–0.2)
Indirect Bilirubin: 1.4 mg/dL — ABNORMAL HIGH (ref 0.3–0.9)
Total Bilirubin: 1.9 mg/dL — ABNORMAL HIGH (ref 0.3–1.2)
Total Protein: 4.5 g/dL — ABNORMAL LOW (ref 6.5–8.1)

## 2018-07-18 LAB — CBC
HCT: 32.6 % — ABNORMAL LOW (ref 39.0–52.0)
Hemoglobin: 10.4 g/dL — ABNORMAL LOW (ref 13.0–17.0)
MCH: 29.7 pg (ref 26.0–34.0)
MCHC: 31.9 g/dL (ref 30.0–36.0)
MCV: 93.1 fL (ref 80.0–100.0)
PLATELETS: 69 10*3/uL — AB (ref 150–400)
RBC: 3.5 MIL/uL — ABNORMAL LOW (ref 4.22–5.81)
RDW: 12.6 % (ref 11.5–15.5)
WBC: 5.9 10*3/uL (ref 4.0–10.5)

## 2018-07-18 LAB — MAGNESIUM: MAGNESIUM: 1.9 mg/dL (ref 1.7–2.4)

## 2018-07-18 LAB — GLUCOSE, CAPILLARY
GLUCOSE-CAPILLARY: 102 mg/dL — AB (ref 70–99)
GLUCOSE-CAPILLARY: 117 mg/dL — AB (ref 70–99)
GLUCOSE-CAPILLARY: 156 mg/dL — AB (ref 70–99)
GLUCOSE-CAPILLARY: 96 mg/dL (ref 70–99)
Glucose-Capillary: 151 mg/dL — ABNORMAL HIGH (ref 70–99)
Glucose-Capillary: 144 mg/dL — ABNORMAL HIGH (ref 70–99)

## 2018-07-18 LAB — LACTIC ACID, PLASMA: Lactic Acid, Venous: 1.4 mmol/L (ref 0.5–1.9)

## 2018-07-18 LAB — CK: Total CK: 2289 U/L — ABNORMAL HIGH (ref 49–397)

## 2018-07-18 LAB — APTT: APTT: 34 s (ref 24–36)

## 2018-07-18 MED ORDER — POTASSIUM CHLORIDE 20 MEQ/15ML (10%) PO SOLN
20.0000 meq | Freq: Once | ORAL | Status: AC
  Administered 2018-07-18: 20 meq
  Filled 2018-07-18: qty 15

## 2018-07-18 MED ORDER — FOLIC ACID 1 MG PO TABS
1.0000 mg | ORAL_TABLET | Freq: Every day | ORAL | Status: DC
  Administered 2018-07-19: 1 mg
  Filled 2018-07-18: qty 1

## 2018-07-18 MED ORDER — VITAMIN K1 10 MG/ML IJ SOLN
5.0000 mg | Freq: Once | INTRAVENOUS | Status: AC
  Administered 2018-07-18: 5 mg via INTRAVENOUS
  Filled 2018-07-18: qty 0.5

## 2018-07-18 MED ORDER — FOLIC ACID 1 MG PO TABS: 1.0000 mg | ORAL_TABLET | Freq: Every day | ORAL | Status: DC

## 2018-07-18 MED ORDER — POTASSIUM CHLORIDE 20 MEQ/15ML (10%) PO SOLN
40.0000 meq | Freq: Once | ORAL | Status: AC
  Administered 2018-07-18: 40 meq
  Filled 2018-07-18: qty 30

## 2018-07-18 MED ORDER — VANCOMYCIN HCL IN DEXTROSE 1-5 GM/200ML-% IV SOLN
1000.0000 mg | Freq: Three times a day (TID) | INTRAVENOUS | Status: DC
  Administered 2018-07-18 – 2018-07-19 (×4): 1000 mg via INTRAVENOUS
  Filled 2018-07-18 (×5): qty 200

## 2018-07-18 MED ORDER — FOLIC ACID 5 MG/ML IJ SOLN
1.0000 mg | Freq: Every day | INTRAMUSCULAR | Status: DC
  Administered 2018-07-18: 1 mg via INTRAVENOUS
  Filled 2018-07-18: qty 0.2

## 2018-07-18 MED ORDER — VITAMIN B-1 100 MG PO TABS: 100.0000 mg | ORAL_TABLET | Freq: Every day | ORAL | Status: DC

## 2018-07-18 MED ORDER — DEXMEDETOMIDINE HCL IN NACL 400 MCG/100ML IV SOLN
0.4000 ug/kg/h | INTRAVENOUS | Status: DC
  Administered 2018-07-18: 0.4 ug/kg/h via INTRAVENOUS
  Administered 2018-07-19: 0.7 ug/kg/h via INTRAVENOUS
  Administered 2018-07-19: 0.5 ug/kg/h via INTRAVENOUS
  Filled 2018-07-18: qty 100

## 2018-07-18 MED ORDER — VITAMIN B-1 100 MG PO TABS
100.0000 mg | ORAL_TABLET | Freq: Every day | ORAL | Status: DC
  Administered 2018-07-19: 100 mg
  Filled 2018-07-18: qty 1

## 2018-07-18 MED ORDER — THIAMINE HCL 100 MG/ML IJ SOLN
100.0000 mg | Freq: Every day | INTRAMUSCULAR | Status: DC
  Administered 2018-07-18: 100 mg via INTRAVENOUS
  Filled 2018-07-18: qty 2

## 2018-07-18 MED ORDER — ASPIRIN 81 MG PO CHEW
81.0000 mg | CHEWABLE_TABLET | Freq: Every day | ORAL | Status: DC
  Administered 2018-07-18: 81 mg
  Filled 2018-07-18: qty 1

## 2018-07-18 NOTE — Progress Notes (Signed)
CRITICAL VALUE ALERT  Critical Value:  Troponin 8.5  Date & Time Notied:  07/18/18 6:40 AM  Provider Notified: Pola Corn

## 2018-07-18 NOTE — Progress Notes (Signed)
Pt placed back full support for night rest.

## 2018-07-18 NOTE — Progress Notes (Signed)
Pharmacy Antibiotic Note  Randy Mcintyre is a 22 y.o. male admitted on 07/16/2018 after being found unresponsive. Pharmacy has been consulted for vancomycin and aztreonam dosing for PNA.  Patient's renal function has further improved today, with CrCL ~152ml/min. Today patient is febrile (Tmax 100.8),  but WBC stable and improved from admission from 18.5 to 5.9.  Plan: Increase vancomycin to 1000mg  IV Q8H  Continue Azactam 1g IV Q8H Monitor renal fxn, clinical progress, vanc trough at Css Follow up culture and sensitivities   Height: 5' 9.5" (176.5 cm) Weight: 199 lb 15.3 oz (90.7 kg) IBW/kg (Calculated) : 71.85  Temp (24hrs), Avg:100.4 F (38 C), Min:97.5 F (36.4 C), Max:102.4 F (39.1 C)  Recent Labs  Lab 07/16/18 1333 07/16/18 1338 07/16/18 1525 07/16/18 1535 07/16/18 1646 07/16/18 2007 07/17/18 0002 07/17/18 0451 07/18/18 0422  WBC 18.5*  --   --   --   --   --   --  6.7 5.9  CREATININE 3.24* 3.40*  --   --  2.68* 2.41* 2.00* 1.65* 1.02  LATICACIDVEN  --  3.84* 3.70* 4.2*  --   --   --   --   --     Estimated Creatinine Clearance: 126.5 mL/min (by C-G formula based on SCr of 1.02 mg/dL).    Allergies  Allergen Reactions  . Penicillins Other (See Comments)    Severe mouth ulcers Has patient had a PCN reaction causing immediate rash, facial/tongue/throat swelling, SOB or lightheadedness with hypotension: No HAS PT DEVELOPED SEVERE RASH INVOLVING MUCUS MEMBRANES or SKIN NECROSIS:  YES SEVERE ORAL ULCERS. Has patient had a PCN reaction that required hospitalization: No Has patient had a PCN reaction occurring within the last 10 years: Yes If all of the above answers are "NO", then may proceed with Cephalosporin use.   Antibiotics this admission: Vanc 10/6 >> Azactam 10/6 >>  Cultures 10/6 BCx -  10/6 MRSA PCR - positive 10/6 Trach Cx - Moderate staph aureus  Thank you for allowing pharmacy to be a part of this patient's care.  Lenord Carbo, PharmD PGY1  Pharmacy Resident Phone: [phone]  Please check AMION for all [facility] Pharm[facility] phone numbers 07/18/2018, 10:41 AM

## 2018-07-18 NOTE — Progress Notes (Signed)
eLink Physician-Brief Progress Note Patient Name: Randy Mcintyre DOB: [birth year] MRN: [id number]   Date of Service  07/18/2018  HPI/Events of Note  Troponin = 8.50 - LVEF = 45% to 50% with diffuse hypokinesis. EKG from 10/6 with inferior ischemia.   eICU Interventions  Will order: 1. ASA 81 mg per tube now and Q day. 2. 12 Lead EKG now. 3. Continue to trend Troponin.     Intervention Category Intermediate Interventions: Diagnostic test evaluation  Sommer,Steven Dennard Nip 07/18/2018, 6:56 AM

## 2018-07-18 NOTE — Progress Notes (Signed)
Patient became very agitated at shift change while trying to turn him. He then vomited tube feed and RN which immediately stopped feed and OG was then hooked to suction. Lung sounds are clear to auscultation. Called Elink MD for continuous sedation and bilateral wrist restrains since he was trying to reach for the ET tube. Will continue to monitor throughout shift.

## 2018-07-18 NOTE — Progress Notes (Signed)
NAME:  Randy Mcintyre, MRN:  [id number], DOB:  [birth year], LOS: 2 ADMISSION DATE:  07/16/2018, CONSULTATION DATE:  07/16/2018 REFERRING MD:  EDP - Long, CHIEF COMPLAINT:  Obtunded   Brief History   22 year old previously active male who fell from a deerstand and fractured his right foot.  Patient was given mobic, norco and neurontin.  Patient was lethargic this AM and was left to rest.  3 hours later was found unresponsive and EMS was called.  Narcan was given without effect.  Patient was brought to the ER where he was intubated and a pill fragment was found in the back of his throat.  PCCM was called to admit.  CT of the chest negative for PE and head CT was negative.  Past Medical History  None Significant Hospital Events   10/6 admission for respiratory failure, renal failure and shocked liver  Consults: date of consult/date signed off & final recs:  Neurology  Procedures (surgical and bedside):  ETT 10/6>>> L IJ TLC 10/6>>>  Significant Diagnostic Tests:  Head CT 10/6>>>Negative Chest CT 10/6>>> negative for PE but there is LLL infiltrate EEG 10/6 >> no evidence active seizures, consistent with metabolic encephalopathy  Micro Data:  Blood 10/6>>> Urine 10/6>>> Sputum 10/6>>> GNR, GPR >> staph aureus>>  Antimicrobials:  Vanc 10/6>>> Aztreonam 10/6>>>   Subjective:  More awake following commands Weaning  Objective   Blood pressure 126/63, pulse (!) 110, temperature 100.2 F (37.9 C), resp. rate (!) 21, height 5' 9.5" (1.765 m), weight 90.7 kg, SpO2 95 %. CVP:  [8 mmHg-19 mmHg] 9 mmHg  Vent Mode: PSV;CPAP FiO2 (%):  [50 %-100 %] 50 % Set Rate:  [26 bmp] 26 bmp Vt Set:  [570 mL] 570 mL PEEP:  [5 cmH20] 5 cmH20 Pressure Support:  [12 cmH20] 12 cmH20 Plateau Pressure:  [18 cmH20-20 cmH20] 20 cmH20   Intake/Output Summary (Last 24 hours) at 07/18/2018 1014 Last data filed at 07/18/2018 0900 Gross per 24 hour  Intake 2716.38 ml  Output 1592 ml  Net 1124.38 ml   Filed  Weights   07/17/18 0936  Weight: 90.7 kg    Examination: General: Well-nourished male in no acute distress HEENT: Tracheal tube in place t Neuro: Dull effect with follows commands CV: s1s2 rrr, no m/r/g PULM: even/non-labored, lungs bilaterally rhonchi with diminished breath sounds at the base WU:JWJX, non-tender, bsx4 active  Extremities: warm/dry, negative edema  Skin: Areas of rash abrasions and ecchymosis   Resolved Hospital Problem list   None  Assessment & Plan:  22 year old male with no significant PMH other than the lower extremity fracture presented with acute encephalopathy and acute respiratory failure.  Required ventilation for airway protection.  He has acute renal dysfunction, transaminitis suspected to be shock liver.  He is hemodynamically improved but remains encephalopathic  VDRF due to airway protection, encephalopathy: Wean from ventilator as tolerated VAP protocol Treat suspected pneumonia  Circulatory shock: septic vs hypovolemic, resolved Off pressor support Recheck lactic acid   Left lower lobe aspiration pneumonia: Continue current antimicrobial therapy with vancomycin and Azactam Sputum culture growing staph aureus  Acute renal failure: Lab Results  Component Value Date   CREATININE 1.02 07/18/2018   CREATININE 1.65 (H) 07/17/2018   CREATININE 2.00 (H) 07/17/2018   Recent Labs  Lab 07/17/18 0002 07/17/18 0451 07/18/18 0422  K 3.8 3.4* 2.8*    Neurology is following Creatinine is improved Potassium repleted  Hyperkalemia, resolved Follow BMP, urine output Discontinue bicarbonate gtt  Transaminitis, suspect shock liver but concern for possible chronic changes given his thrombocytopenia, coagulopathy Tylenol level less than 10 Hepatitis panel was negative  Coagulopathy, presumed hepatic Continue to follow LFTs Continue to follow INR  Acute encephalopathy, toxic metabolic More awake on 07/18/2018 Follow mental status Add  thiamine and folic acid for complete  Recent left lower extremity trauma and fracture Appreciate orthopedics input  Family/ GOC  11/18/2017 father updated at bedside  Disposition / Summary of Today's Plan 07/18/18   Wean from ventilator as tolerated Repeat LFT Repeat troponin Replete potassium Add thiamine and folic acid for completeness  Labs   CBC: Recent Labs  Lab 07/16/18 1333 07/16/18 1338 07/17/18 0451 07/17/18 0545 07/18/18 0422  WBC 18.5*  --  6.7  --  5.9  NEUTROABS 15.1*  --   --   --   --   HGB 12.9* 13.6 10.5*  --  10.4*  HCT 41.3 40.0 32.9*  --  32.6*  MCV 95.6  --  93.7  --  93.1  PLT 114*  --  53* 53* 69*    Basic Metabolic Panel: Recent Labs  Lab 07/16/18 1535 07/16/18 1646 07/16/18 2007 07/17/18 0002 07/17/18 0451 07/18/18 0422  NA  --  132* 129* 137 137 140  K  --  6.5* 5.2* 3.8 3.4* 2.8*  CL  --  107 104 107 106 106  CO2  --  18* 18* 23 25 26   GLUCOSE  --  258* 255* 218* 144* 147*  BUN  --  29* 30* 27* 25* 15  CREATININE  --  2.68* 2.41* 2.00* 1.65* 1.02  CALCIUM  --  6.7* 6.9* 7.2* 7.4* 8.0*  MG 2.0 2.0  --   --  2.1 1.9  PHOS 5.0*  --   --   --  2.5  --    GFR: Estimated Creatinine Clearance: 126.5 mL/min (by C-G formula based on SCr of 1.02 mg/dL). Recent Labs  Lab 07/16/18 1333 07/16/18 1338 07/16/18 1525 07/16/18 1535 07/17/18 0451 07/18/18 0422  WBC 18.5*  --   --   --  6.7 5.9  LATICACIDVEN  --  3.84* 3.70* 4.2*  --   --     Liver Function Tests: Recent Labs  Lab 07/16/18 1333 07/17/18 0002 07/17/18 0451  AST 2,640* 9,888* 9,812*  ALT 3,096* 4,628* 4,776*  ALKPHOS 56 41 43  BILITOT 1.1 1.3* 1.2  PROT 6.2* 4.7* 4.8*  ALBUMIN 3.4* 2.5* 2.5*   Recent Labs  Lab 07/16/18 1333  LIPASE 34   Recent Labs  Lab 07/16/18 1646  AMMONIA 47*    ABG    Component Value Date/Time   PHART 7.417 07/17/2018 0330   PCO2ART 37.9 07/17/2018 0330   PO2ART 112 (H) 07/17/2018 0330   HCO3 23.9 07/17/2018 0330   TCO2 19 (L)  07/16/2018 2028   ACIDBASEDEF 0.0 07/17/2018 0330   O2SAT 98.2 07/17/2018 0330     Coagulation Profile: Recent Labs  Lab 07/16/18 1535 07/17/18 0545 07/18/18 0422  INR 1.82 1.87 1.76    Cardiac Enzymes: Recent Labs  Lab 07/16/18 1535 07/17/18 0451 07/18/18 0422  CKTOTAL 1,173* 2,820* 2,289*  TROPONINI  --   --  8.50*    HbA1C: No results found for: HGBA1C  CBG: Recent Labs  Lab 07/17/18 1517 07/17/18 1936 07/17/18 2323 07/18/18 0403 07/18/18 0744  GLUCAP 120* 130* 149* 151* 156*   App cct 34 min   Brett Canales Vonzella Althaus ACNP [facility] Pager [phone] till 1  pm If no answer page [phone] 07/18/2018, 10:14 AM

## 2018-07-18 NOTE — Progress Notes (Signed)
eLink Physician-Brief Progress Note Patient Name: Randy Mcintyre DOB: 25-Aug-1996 MRN: [id number]   Date of Service  07/18/2018  HPI/Events of Note  Agitation - Starting to wake up. Biting on EET. Request for sedation and bilateral soft wrist restraints.   eICU Interventions  Will order: 1. Soft bilateral wrist restraints.  2. Precedex IV infusion. Titrate to RASS = 0 to -1.      Intervention Category Minor Interventions: Agitation / anxiety - evaluation and management  Sommer,Steven Eugene 07/18/2018, 7:41 PM

## 2018-07-18 NOTE — Consult Note (Signed)
WOC Nurse wound consult note Patient evaluated in [facility] 3M12.  No family present.  Primary care RN in room. Reason for Consult: DTI to left ear pinna Wound type:  DTI Pressure Injury POA: No Measurement: 2.8 cm x 1.2 cm Wound bed: Maroon Drainage (amount, consistency, odor) no drainage, no odor Periwound: Intact Dressing procedure/placement/frequency: Foam dressing, change every 3 days. Monitor the wound area(s) for worsening of condition such as: Signs/symptoms of infection,  Increase in size,  Development of or worsening of odor, Development of pain, or increased pain at the affected locations.  Notify the medical team if any of these develop.  Thank you for the consult.  Discussed plan of care with the patient and bedside nurse.  WOC nurse will not follow at this time.  Please re-consult the WOC team if needed.  Helmut Muster, RN, MSN, CWOCN, CNS-BC, pager [phone]

## 2018-07-19 ENCOUNTER — Inpatient Hospital Stay (HOSPITAL_COMMUNITY): Payer: Medicaid Other

## 2018-07-19 DIAGNOSIS — N170 Acute kidney failure with tubular necrosis: Secondary | ICD-10-CM

## 2018-07-19 DIAGNOSIS — L8990 Pressure ulcer of unspecified site, unspecified stage: Secondary | ICD-10-CM

## 2018-07-19 LAB — VANCOMYCIN, TROUGH: Vancomycin Tr: 6 ug/mL — ABNORMAL LOW (ref 15–20)

## 2018-07-19 LAB — PROCALCITONIN: PROCALCITONIN: 5.97 ng/mL

## 2018-07-19 LAB — HEPATIC FUNCTION PANEL
ALBUMIN: 2.4 g/dL — AB (ref 3.5–5.0)
ALK PHOS: 47 U/L (ref 38–126)
ALT: 3032 U/L — ABNORMAL HIGH (ref 0–44)
AST: 1805 U/L — ABNORMAL HIGH (ref 15–41)
BILIRUBIN INDIRECT: 1.3 mg/dL — AB (ref 0.3–0.9)
Bilirubin, Direct: 0.5 mg/dL — ABNORMAL HIGH (ref 0.0–0.2)
Total Bilirubin: 1.8 mg/dL — ABNORMAL HIGH (ref 0.3–1.2)
Total Protein: 4.6 g/dL — ABNORMAL LOW (ref 6.5–8.1)

## 2018-07-19 LAB — BASIC METABOLIC PANEL
ANION GAP: 6 (ref 5–15)
BUN: 13 mg/dL (ref 6–20)
CALCIUM: 7.8 mg/dL — AB (ref 8.9–10.3)
CO2: 25 mmol/L (ref 22–32)
Chloride: 109 mmol/L (ref 98–111)
Creatinine, Ser: 0.75 mg/dL (ref 0.61–1.24)
GLUCOSE: 119 mg/dL — AB (ref 70–99)
POTASSIUM: 3.2 mmol/L — AB (ref 3.5–5.1)
SODIUM: 140 mmol/L (ref 135–145)

## 2018-07-19 LAB — CBC
HCT: 32.6 % — ABNORMAL LOW (ref 39.0–52.0)
HEMOGLOBIN: 10.4 g/dL — AB (ref 13.0–17.0)
MCH: 29.7 pg (ref 26.0–34.0)
MCHC: 31.9 g/dL (ref 30.0–36.0)
MCV: 93.1 fL (ref 80.0–100.0)
Platelets: 97 10*3/uL — ABNORMAL LOW (ref 150–400)
RBC: 3.5 MIL/uL — ABNORMAL LOW (ref 4.22–5.81)
RDW: 12.4 % (ref 11.5–15.5)
WBC: 6 10*3/uL (ref 4.0–10.5)
nRBC: 0.3 % — ABNORMAL HIGH (ref 0.0–0.2)

## 2018-07-19 LAB — GLUCOSE, CAPILLARY
GLUCOSE-CAPILLARY: 111 mg/dL — AB (ref 70–99)
GLUCOSE-CAPILLARY: 138 mg/dL — AB (ref 70–99)
GLUCOSE-CAPILLARY: 99 mg/dL (ref 70–99)
GLUCOSE-CAPILLARY: 78 mg/dL (ref 70–99)
Glucose-Capillary: 102 mg/dL — ABNORMAL HIGH (ref 70–99)

## 2018-07-19 LAB — CULTURE, RESPIRATORY W GRAM STAIN

## 2018-07-19 LAB — CULTURE, RESPIRATORY

## 2018-07-19 LAB — TROPONIN I: TROPONIN I: 2.32 ng/mL — AB (ref <0.03)

## 2018-07-19 LAB — MAGNESIUM: MAGNESIUM: 1.6 mg/dL — AB (ref 1.7–2.4)

## 2018-07-19 LAB — PHOSPHORUS: Phosphorus: 2.1 mg/dL — ABNORMAL LOW (ref 2.5–4.6)

## 2018-07-19 LAB — PROTIME-INR
INR: 1.42
PROTHROMBIN TIME: 17.2 s — AB (ref 11.4–15.2)

## 2018-07-19 MED ORDER — POTASSIUM PHOSPHATES 15 MMOLE/5ML IV SOLN
30.0000 mmol | Freq: Once | INTRAVENOUS | Status: AC
  Administered 2018-07-19: 30 mmol via INTRAVENOUS
  Filled 2018-07-19: qty 10

## 2018-07-19 MED ORDER — MAGNESIUM SULFATE 2 GM/50ML IV SOLN
2.0000 g | Freq: Once | INTRAVENOUS | Status: AC
  Administered 2018-07-19: 2 g via INTRAVENOUS
  Filled 2018-07-19: qty 50

## 2018-07-19 MED ORDER — DOCUSATE SODIUM 50 MG/5ML PO LIQD
100.0000 mg | Freq: Two times a day (BID) | ORAL | Status: DC
  Administered 2018-07-19 – 2018-07-20 (×2): 100 mg
  Filled 2018-07-19 (×2): qty 10

## 2018-07-19 MED ORDER — VANCOMYCIN HCL IN DEXTROSE 1-5 GM/200ML-% IV SOLN
1000.0000 mg | Freq: Four times a day (QID) | INTRAVENOUS | Status: AC
  Administered 2018-07-19 – 2018-07-23 (×14): 1000 mg via INTRAVENOUS
  Filled 2018-07-19 (×15): qty 200

## 2018-07-19 MED ORDER — ALTEPLASE 2 MG IJ SOLR
2.0000 mg | Freq: Once | INTRAMUSCULAR | Status: AC
  Administered 2018-07-19: 2 mg

## 2018-07-19 MED ORDER — SODIUM CHLORIDE 0.9 % IV SOLN
1.0000 g | Freq: Three times a day (TID) | INTRAVENOUS | Status: DC
  Administered 2018-07-19: 1 g via INTRAVENOUS
  Filled 2018-07-19 (×2): qty 1

## 2018-07-19 MED ORDER — CHLORHEXIDINE GLUCONATE 0.12 % MT SOLN
OROMUCOSAL | Status: AC
  Filled 2018-07-19: qty 15

## 2018-07-19 NOTE — Procedures (Signed)
Extubation Procedure Note  Patient Details:   Name: Braidon Chermak DOB: 29-Jun-1996 MRN: [id number]   Airway Documentation:    Vent end date: 07/19/18 Vent end time: 1210   Evaluation  O2 sats: stable throughout Complications: No apparent complications Patient did tolerate procedure well. Bilateral Breath Sounds: Clear   Yes   Patient was extubated to a 6L [place] without any complications, dyspnea or stridor noted. Patient was instructed on IS x 5, highest goal reached was .   Tremaine Earwood, Margaretmary Dys 07/19/2018, 12:10 PM

## 2018-07-19 NOTE — Progress Notes (Signed)
Pharmacy Antibiotic Note  Randy Mcintyre is a 22 y.o. male admitted on 07/16/2018 after being found unresponsive. Pharmacy has been consulted for vancomycin for pneumonia.  Patient's renal function has further improved today, with Scr of 0.75 and CrCL ~119ml/min. Today patient remains febrile (Tmax 100.8),  but WBC stable and improved from admission from 18.5 to 6.0. Patient's trach culture has finalized and grown MRSA. Patient's Vancomycin trough this AM was subtherapeutic at 6.   Plan: Increase vancomycin to 1000mg  IV Q6H (goal trough 15 - 20 mcg/mL) Discontinue Azactam based on confirmed trach culture and patient being subtherapeutic on previous vancomycin dose Will order vancomycin trough prior to dose in AM to assess appropriateness Monitor renal fxn, CBC, clinical progress   Height: 5' 9.5" (176.5 cm) Weight: 199 lb 15.3 oz (90.7 kg) IBW/kg (Calculated) : 71.85  Temp (24hrs), Avg:100.5 F (38.1 C), Min:99.5 F (37.5 C), Max:100.9 F (38.3 C)  Recent Labs  Lab 07/16/18 1333 07/16/18 1338 07/16/18 1525 07/16/18 1535  07/16/18 2007 07/17/18 0002 07/17/18 0451 07/18/18 0422 07/18/18 1036 07/19/18 0335 07/19/18 0756 07/19/18 0905  WBC 18.5*  --   --   --   --   --   --  6.7 5.9  --   --   --  6.0  CREATININE 3.24* 3.40*  --   --    < > 2.41* 2.00* 1.65* 1.02  --  0.75  --   --   LATICACIDVEN  --  3.84* 3.70* 4.2*  --   --   --   --   --  1.4  --   --   --   VANCOTROUGH  --   --   --   --   --   --   --   --   --   --   --  6*  --    < > = values in this interval not displayed.    Estimated Creatinine Clearance: 161.3 mL/min (by C-G formula based on SCr of 0.75 mg/dL).    Allergies  Allergen Reactions  . Penicillins Other (See Comments)    Severe mouth ulcers Has patient had a PCN reaction causing immediate rash, facial/tongue/throat swelling, SOB or lightheadedness with hypotension: No HAS PT DEVELOPED SEVERE RASH INVOLVING MUCUS MEMBRANES or SKIN NECROSIS:  YES SEVERE  ORAL ULCERS. Has patient had a PCN reaction that required hospitalization: No Has patient had a PCN reaction occurring within the last 10 years: Yes If all of the above answers are "NO", then may proceed with Cephalosporin use.   Antibiotics this admission: Vanc 10/6 >> Azactam 10/6 >> 10/9  Cultures 10/6 BCx - ngtd 10/6 MRSA PCR - positive 10/6 Trach Cx - MRSA (S to vancomycin)  Thank you for allowing pharmacy to be a part of this patient's care.  Lenord Carbo, PharmD PGY1 Pharmacy Resident Phone: [phone]  Please check AMION for all [facility] Pharmacy phone numbers 07/19/2018, 1:21 PM

## 2018-07-19 NOTE — Progress Notes (Signed)
NAME:  Randy Mcintyre, MRN:  [id number], DOB:  1996/05/17, LOS: 3 ADMISSION DATE:  07/16/2018, CONSULTATION DATE:  07/16/2018 REFERRING MD:  EDP - Long, CHIEF COMPLAINT:  Obtunded   Brief History   22 year old previously active male who fell from a deerstand 07/08/18 and fractured his right foot.  Patient was given mobic, norco and neurontin.  Patient was found lethargic at home and was left to rest.  3 hours later was found unresponsive and EMS was called.  Narcan was given without effect.  Patient was brought to the ER 10/6, where he was intubated and a pill fragment was found in the back of his throat.  PCCM was called to admit.  CT of the chest negative for PE and head CT was negative.  UDS + benzo, opiates, cocaine, THC  Past Medical History  None, probable poly-substance abuse  Significant Hospital Events   10/6 admission for respiratory failure, renal failure and shocked liver 10/9 vomiting x 2 overnight  Consults: date of consult/date signed off & final recs:  Neurology Nephrology - signed off 10/7  Procedures (surgical and bedside):  ETT 10/6>>> L IJ TLC 10/6>>>  Significant Diagnostic Tests:  Head CT 10/6>>>Negative Chest CT 10/6>>> negative for PE but there is LLL infiltrate EEG 10/6 >> no evidence active seizures, consistent with metabolic encephalopathy US renal 10/6 >> neg TTE 10/7 >> Mildly dilated LV with EF 45-50%, diffuse hypokinesis. Mildly dilated RV with normal systolic function. Borderline pulmonary hypertension. No significant valvular abnormalities.  Micro Data:  Blood 10/6>>> Urine 10/6>>> Sputum 10/6>>> GNR, GPR >> staph aureus>> 10/6 MRSA PCR >> positive   Antimicrobials:  Vanc 10/6>>> Aztreonam 10/6>>>10/8; 10/9 >>  Subjective:  Vomited x 2 overnight, unclear last BM Intermittent agitation, on precedex 0.5 mcg/kg/min and prn Versed Improving LFTs,  Net + 1.9L  tmax 100.9  Objective   Blood pressure 112/71, pulse 92, temperature (!) 100.4 F (38  C), resp. rate (!) 28, height 5' 9.5" (1.765 m), weight 90.7 kg, SpO2 91 %. CVP:  [7 mmHg-9 mmHg] 8 mmHg  Vent Mode: PSV;CPAP FiO2 (%):  [40 %-50 %] 40 % Set Rate:  [15 bmp-26 bmp] 15 bmp Vt Set:  [570 mL] 570 mL PEEP:  [5 cmH20] 5 cmH20 Pressure Support:  [5 cmH20-12 cmH20] 5 cmH20 Plateau Pressure:  [19 cmH20] 19 cmH20   Intake/Output Summary (Last 24 hours) at 07/19/2018 1324 Last data filed at 07/19/2018 0753 Gross per 24 hour  Intake 2653.01 ml  Output 1995 ml  Net 658.01 ml   Filed Weights   07/17/18 0936  Weight: 90.7 kg    Examination: General:  Critically ill young adult male on MV in NAD HEENT: MM pink/moist, ETT/ OGT- clamped, pupils 4/reactive Neuro: opens eyes to heavy stimulation, f/c in all extremities CV: s1s2 rrr, no m/r/g, +2 pulses PULM: even/non-labored on MV, mildly tachypneic, lungs bilaterally clear, diminished in bases GI: soft, non-tender, bs active  Extremities: warm/dry, trace generalized edema, LLE wrapped - brisk cap refill Skin: no rashes, areas of ecchymosis  Resolved Hospital Problem list   Circulatory shock: septic vs hypovolemic, resolved  Assessment & Plan:  22 year old male with no significant PMH other than the lower extremity fracture 9/28 from falls presented with acute encephalopathy and acute respiratory failure in the setting of polysubstance abuse.  Required ventilation for airway protection.  Found to have acute renal dysfunction, transaminitis suspected to be shock liver, now is resolving.  He remains hemodynamically with slow  to improve encephalopathy  VDRF due to airway protection, encephalopathy: - CXR reviewed 10/9- stable ETT/ gastric tube, increasing RLL atelectelasis vs infiltrate  Hopeful to wean sedation and extubate if able PRVC 8 cc/kg full support, daily SBT VAP protocol Continue vanc given +MRSA PCR Add back Aztreonam given concern for aspiration Check PCT   Left lower lobe aspiration pneumonia: As above    Follow sputum cx   Acute renal failure: Lab Results  Component Value Date   CREATININE 0.75 07/19/2018   CREATININE 1.02 07/18/2018   CREATININE 1.65 (H) 07/17/2018   Recent Labs  Lab 07/17/18 0451 07/18/18 0422 07/19/18 0335  K 3.4* 2.8* 3.2*   Trending renal panel/ UOP/ daily wts   Hyperkalemia, resolved- now Hypokalemic with hypoMag/ hypophos kphos 30 mmol now Mag 2 gm x 1 Daily BMET/ Mag  Transaminitis, suspect shock liver but concern for possible chronic changes given his thrombocytopenia, coagulopathy, ? ETOH hx  Tylenol level less than 10 Hepatitis panel was negative LFT continue to improve P:  Continue daily thiamine/ folate Trending LFTs  Coagulopathy, presumed hepatic Trending  CBC INR- stable, LFTs improving  Acute encephalopathy, toxic metabolic- slow to improve Add thiamine and folic acid for complete PAD protocol with precedex, d/c prn versed, continue prn fentanyl Colace BID, prn dulcolax   Recent left lower extremity trauma and fracture -ortho following, surgery deferred given critical illness/ coagulopathy for now  Vomiting - holding TF for now x 4 - if patient fails SBT, will slowly restart TF as tolerated - consider reglan - unclear last BM, abd soft, +BS, will change colace BID, prn dulcolax  - goal K>4, Mag >2 to prevent ileus   Disposition / Summary of Today's Plan 07/19/18   Attempt SBT/ extubate if able Replace electrolytes  Pain/Anxiety/Delirium protocol (if indicated): precedex/ prn fentanyl VAP protocol (if indicated): yes DVT prophylaxis: SCD RLE, hold VTE given thrombocytopenia  GI prophylaxis: protonix PO Hyperglycemia protocol: SSI, CBG q 4 Mobility: BR Code Status: Full  Family Communication: no family at bedside 10/9 on am rounds.   Labs   CBC: Recent Labs  Lab 07/16/18 1333 07/16/18 1338 07/17/18 0451 07/17/18 0545 07/18/18 0422  WBC 18.5*  --  6.7  --  5.9  NEUTROABS 15.1*  --   --   --   --   HGB 12.9*  13.6 10.5*  --  10.4*  HCT 41.3 40.0 32.9*  --  32.6*  MCV 95.6  --  93.7  --  93.1  PLT 114*  --  53* 53* 69*    Basic Metabolic Panel: Recent Labs  Lab 07/16/18 1535 07/16/18 1646 07/16/18 2007 07/17/18 0002 07/17/18 0451 07/18/18 0422 07/19/18 0335  NA  --  132* 129* 137 137 140 140  K  --  6.5* 5.2* 3.8 3.4* 2.8* 3.2*  CL  --  107 104 107 106 106 109  CO2  --  18* 18* 23 25 26 25   GLUCOSE  --  258* 255* 218* 144* 147* 119*  BUN  --  29* 30* 27* 25* 15 13  CREATININE  --  2.68* 2.41* 2.00* 1.65* 1.02 0.75  CALCIUM  --  6.7* 6.9* 7.2* 7.4* 8.0* 7.8*  MG 2.0 2.0  --   --  2.1 1.9 1.6*  PHOS 5.0*  --   --   --  2.5  --  2.1*   GFR: Estimated Creatinine Clearance: 161.3 mL/min (by C-G formula based on SCr of 0.75 mg/dL). Recent Labs  Lab 07/16/18 1333 07/16/18 1338 07/16/18 1525 07/16/18 1535 07/17/18 0451 07/18/18 0422 07/18/18 1036  WBC 18.5*  --   --   --  6.7 5.9  --   LATICACIDVEN  --  3.84* 3.70* 4.2*  --   --  1.4    Liver Function Tests: Recent Labs  Lab 07/16/18 1333 07/17/18 0002 07/17/18 0451 07/18/18 1004 07/19/18 0335  AST 2,640* 9,888* 9,812* 5,005* 1,805*  ALT 3,096* 4,628* 4,776* 5,262* 3,032*  ALKPHOS 56 41 43 48 47  BILITOT 1.1 1.3* 1.2 1.9* 1.8*  PROT 6.2* 4.7* 4.8* 4.5* 4.6*  ALBUMIN 3.4* 2.5* 2.5* 2.3* 2.4*   Recent Labs  Lab 07/16/18 1333  LIPASE 34   Recent Labs  Lab 07/16/18 1646  AMMONIA 47*    ABG    Component Value Date/Time   PHART 7.417 07/17/2018 0330   PCO2ART 37.9 07/17/2018 0330   PO2ART 112 (H) 07/17/2018 0330   HCO3 23.9 07/17/2018 0330   TCO2 19 (L) 07/16/2018 2028   ACIDBASEDEF 0.0 07/17/2018 0330   O2SAT 98.2 07/17/2018 0330     Coagulation Profile: Recent Labs  Lab 07/16/18 1535 07/17/18 0545 07/18/18 0422 07/19/18 0335  INR 1.82 1.87 1.76 1.42    Cardiac Enzymes: Recent Labs  Lab 07/16/18 1535 07/17/18 0451 07/18/18 0422 07/18/18 1004 07/19/18 0335  CKTOTAL 1,173* 2,820* 2,289*  --    --   TROPONINI  --   --  8.50* 6.41* 2.32*    HbA1C: No results found for: HGBA1C  CBG: Recent Labs  Lab 07/18/18 1548 07/18/18 1949 07/18/18 2342 07/19/18 0418 07/19/18 0740  GLUCAP 117* 102* 96 111* 138*   CCT 30 mins  Posey Boyer, AGACNP-BC [facility] Pgr: [phone] or if no answer [phone] 07/19/2018, 8:23 AM

## 2018-07-20 ENCOUNTER — Encounter (HOSPITAL_COMMUNITY): Payer: Self-pay

## 2018-07-20 ENCOUNTER — Other Ambulatory Visit: Payer: Self-pay

## 2018-07-20 ENCOUNTER — Inpatient Hospital Stay (HOSPITAL_COMMUNITY): Payer: Medicaid Other

## 2018-07-20 LAB — MAGNESIUM: Magnesium: 1.7 mg/dL (ref 1.7–2.4)

## 2018-07-20 LAB — CBC
HCT: 32 % — ABNORMAL LOW (ref 39.0–52.0)
Hemoglobin: 10.3 g/dL — ABNORMAL LOW (ref 13.0–17.0)
MCH: 29.1 pg (ref 26.0–34.0)
MCHC: 32.2 g/dL (ref 30.0–36.0)
MCV: 90.4 fL (ref 80.0–100.0)
PLATELETS: 119 10*3/uL — AB (ref 150–400)
RBC: 3.54 MIL/uL — AB (ref 4.22–5.81)
RDW: 12.1 % (ref 11.5–15.5)
WBC: 6.6 10*3/uL (ref 4.0–10.5)
nRBC: 0 % (ref 0.0–0.2)

## 2018-07-20 LAB — RENAL FUNCTION PANEL
Albumin: 2.8 g/dL — ABNORMAL LOW (ref 3.5–5.0)
Anion gap: 9 (ref 5–15)
BUN: 10 mg/dL (ref 6–20)
CHLORIDE: 108 mmol/L (ref 98–111)
CO2: 23 mmol/L (ref 22–32)
CREATININE: 0.7 mg/dL (ref 0.61–1.24)
Calcium: 8.8 mg/dL — ABNORMAL LOW (ref 8.9–10.3)
GFR calc non Af Amer: >60 mL/min (ref >60)
Glucose, Bld: 92 mg/dL (ref 70–99)
Phosphorus: 4.6 mg/dL (ref 2.5–4.6)
Potassium: 3.4 mmol/L — ABNORMAL LOW (ref 3.5–5.1)
Sodium: 140 mmol/L (ref 135–145)

## 2018-07-20 LAB — GLUCOSE, CAPILLARY
GLUCOSE-CAPILLARY: 83 mg/dL (ref 70–99)
Glucose-Capillary: 102 mg/dL — ABNORMAL HIGH (ref 70–99)
Glucose-Capillary: 79 mg/dL (ref 70–99)
Glucose-Capillary: 83 mg/dL (ref 70–99)

## 2018-07-20 LAB — VANCOMYCIN, TROUGH: VANCOMYCIN TR: 14 ug/mL — AB (ref 15–20)

## 2018-07-20 LAB — PROCALCITONIN: Procalcitonin: 3.41 ng/mL

## 2018-07-20 MED ORDER — ORAL CARE MOUTH RINSE
15.0000 mL | Freq: Two times a day (BID) | OROMUCOSAL | Status: DC
  Administered 2018-07-20 (×2): 15 mL via OROMUCOSAL

## 2018-07-20 MED ORDER — POTASSIUM CHLORIDE CRYS ER 20 MEQ PO TBCR: 20.0000 meq | EXTENDED_RELEASE_TABLET | ORAL | Status: DC

## 2018-07-20 MED ORDER — CHLORHEXIDINE GLUCONATE 0.12 % MT SOLN
15.0000 mL | Freq: Two times a day (BID) | OROMUCOSAL | Status: DC
  Administered 2018-07-20 – 2018-07-26 (×13): 15 mL via OROMUCOSAL
  Filled 2018-07-20 (×15): qty 15

## 2018-07-20 MED ORDER — VITAMIN B-1 100 MG PO TABS
100.0000 mg | ORAL_TABLET | Freq: Every day | ORAL | Status: DC
  Administered 2018-07-20 – 2018-07-27 (×8): 100 mg via ORAL
  Filled 2018-07-20 (×9): qty 1

## 2018-07-20 MED ORDER — ENSURE ENLIVE PO LIQD: 237.0000 mL | Freq: Two times a day (BID) | ORAL | Status: DC

## 2018-07-20 MED ORDER — POTASSIUM CHLORIDE 10 MEQ/50ML IV SOLN
10.0000 meq | INTRAVENOUS | Status: AC
  Administered 2018-07-20 (×2): 10 meq via INTRAVENOUS
  Filled 2018-07-20 (×2): qty 50

## 2018-07-20 MED ORDER — ENOXAPARIN SODIUM 40 MG/0.4ML ~~LOC~~ SOLN
40.0000 mg | SUBCUTANEOUS | Status: DC
  Administered 2018-07-20 – 2018-07-25 (×4): 40 mg via SUBCUTANEOUS
  Filled 2018-07-20 (×4): qty 0.4

## 2018-07-20 MED ORDER — FOLIC ACID 1 MG PO TABS
1.0000 mg | ORAL_TABLET | Freq: Every day | ORAL | Status: DC
  Administered 2018-07-20 – 2018-07-27 (×8): 1 mg via ORAL
  Filled 2018-07-20 (×9): qty 1

## 2018-07-20 MED ORDER — DOCUSATE SODIUM 100 MG PO CAPS
100.0000 mg | ORAL_CAPSULE | Freq: Two times a day (BID) | ORAL | Status: DC
  Administered 2018-07-20 – 2018-07-27 (×11): 100 mg via ORAL
  Filled 2018-07-20 (×14): qty 1

## 2018-07-20 MED ORDER — MAGNESIUM SULFATE 2 GM/50ML IV SOLN
2.0000 g | Freq: Once | INTRAVENOUS | Status: AC
  Administered 2018-07-20: 2 g via INTRAVENOUS
  Filled 2018-07-20: qty 50

## 2018-07-20 NOTE — Progress Notes (Signed)
Pharmacy Antibiotic Note  Randy Mcintyre is a 22 y.o. male admitted on 07/16/2018 after being found unresponsive. Pharmacy has been consulted for vancomycin for pneumonia.  Patient's renal function has normalized. Currently patient is afebrile, WBC stable and improved from admission from 18.5 to 6.0. Patient's trach culture has finalized and grown MRSA. Patient's Vancomycin trough this AM was 14. With patient's clinical improvement and slightly subtherapeutic level that will likely accumulate to therapeutic level, will continue current dosing. Patient is on day 5 of 7 of antibioitic treatment with vancomycin.  Plan: Continue vancomycin 1000mg  IV Q6H Continue vancomycin through 10/12, will not not plan to repeat vancomycin trough unless clinically indicated Monitor renal fxn, CBC, clinical progress  Height: 5' 9.5" (176.5 cm) Weight: 199 lb 15.3 oz (90.7 kg) IBW/kg (Calculated) : 71.85  Temp (24hrs), Avg:100.4 F (38 C), Min:99.3 F (37.4 C), Max:101.7 F (38.7 C)  Recent Labs  Lab 07/16/18 1333 07/16/18 1338 07/16/18 1525 07/16/18 1535  07/17/18 0002 07/17/18 0451 07/18/18 0422 07/18/18 1036 07/19/18 0335 07/19/18 0756 07/19/18 0905 07/20/18 0500 07/20/18 0920  WBC 18.5*  --   --   --   --   --  6.7 5.9  --   --   --  6.0 6.6  --   CREATININE 3.24* 3.40*  --   --    < > 2.00* 1.65* 1.02  --  0.75  --   --  0.70  --   LATICACIDVEN  --  3.84* 3.70* 4.2*  --   --   --   --  1.4  --   --   --   --   --   VANCOTROUGH  --   --   --   --   --   --   --   --   --   --  6*  --   --  14*   < > = values in this interval not displayed.    Estimated Creatinine Clearance: 161.3 mL/min (by C-G formula based on SCr of 0.7 mg/dL).    Allergies  Allergen Reactions  . Penicillins Other (See Comments)    Severe mouth ulcers Has patient had a PCN reaction causing immediate rash, facial/tongue/throat swelling, SOB or lightheadedness with hypotension: No HAS PT DEVELOPED SEVERE RASH INVOLVING  MUCUS MEMBRANES or SKIN NECROSIS:  YES SEVERE ORAL ULCERS. Has patient had a PCN reaction that required hospitalization: No Has patient had a PCN reaction occurring within the last 10 years: Yes If all of the above answers are "NO", then may proceed with Cephalosporin use.   Antibiotics this admission: Vanc 10/6 >> Azactam 10/6 >> 10/9  Cultures 10/6 BCx - ngtd 10/6 MRSA PCR - positive 10/6 Trach Cx - MRSA (S to vancomycin)  Thank you for allowing pharmacy to be a part of this patient's care.  Lenord Carbo, PharmD PGY1 Pharmacy Resident Phone: [phone]  Please check AMION for all [facility] Pharmacy phone numbers 07/20/2018, 12:06 PM

## 2018-07-20 NOTE — Progress Notes (Signed)
[facility] ADULT ICU REPLACEMENT PROTOCOL FOR AM LAB REPLACEMENT ONLY  The patient does apply for the [facility] Adult ICU Electrolyte Replacment Protocol based on the criteria listed below:   1. Is GFR >/= 40 ml/min? Yes.    Patient's GFR today is >60 2. Is urine output >/= 0.5 ml/kg/hr for the last 6 hours? Yes.   Patient's UOP is 56 ml/kg/hr 3. Is BUN < 60 mg/dL? Yes.    Patient's BUN today is 10 4. Abnormal electrolyte(s):Potassium-3.4 5. Ordered repletion with:PO potassium 58mEqx2 6. If a panic level lab has been reported, has the CCM MD in charge been notified? No..   Physician: Dr. Orma Render, Burgandy Hackworth [place] 07/20/2018 6:17 AM

## 2018-07-20 NOTE — Plan of Care (Signed)
°  Problem: Education: °Goal: Knowledge of General Education information will improve °Description: Including pain rating scale, medication(s)/side effects and non-pharmacologic comfort measures °Outcome: Progressing °  °Problem: Health Behavior/Discharge Planning: °Goal: Ability to manage health-related needs will improve °Outcome: Progressing °  °Problem: Clinical Measurements: °Goal: Will remain free from infection °Outcome: Progressing °Goal: Diagnostic test results will improve °Outcome: Progressing °Goal: Respiratory complications will improve °Outcome: Progressing °Goal: Cardiovascular complication will be avoided °Outcome: Progressing °  °Problem: Pain Managment: °Goal: General experience of comfort will improve °Outcome: Progressing °  °Problem: Safety: °Goal: Ability to remain free from injury will improve °Outcome: Progressing °  °

## 2018-07-20 NOTE — Evaluation (Signed)
Physical Therapy Evaluation Patient Details Name: Randy Mcintyre MRN: [id number] DOB: November 16, 1995 Today's Date: 07/20/2018   History of Present Illness  22 y.o. male admitted 10/06 with unresponsiveness, acute renal failure, transaminitis. Pt was awaiting surgery for recent fall and L foot fracture.  Pt found next to empty bottle of painkillers, upon admission required intubation 10/06, extubated 10/09 without complications.     Clinical Impression  Pt admitted with above diagnosis. Pt currently with functional limitations due to the deficits listed below (see PT Problem List). Prior to recent fall, patient independent living with fiance and 23 year old. Today pt with improving cognition, slow processing but able to follow commands. Stands with min A but unable to ambulate this visit due to balance and weakness. VSS on RA.  Pt will benefit from skilled PT to increase their independence and safety with mobility to allow discharge to the venue listed below.        Follow Up Recommendations CIR    Equipment Recommendations  (TBD)    Recommendations for Other Services OT consult;Rehab consult     Precautions / Restrictions Restrictions Weight Bearing Restrictions: Yes LLE Weight Bearing: Non weight bearing      Mobility  Bed Mobility Overal bed mobility: Needs Assistance Bed Mobility: Supine to Sit     Supine to sit: Min assist        Transfers Overall transfer level: Needs assistance Equipment used: Rolling walker (2 wheeled) Transfers: Sit to/from Stand Sit to Stand: Min assist         General transfer comment: Min A to stand, cues for NWB, patient able to stand with support of RW and min A to provide stability, posterior lean noted and unable to take steps yet due to weakness. pt denies dizziness, VSS  Ambulation/Gait                Stairs            Wheelchair Mobility    Modified Rankin (Stroke Patients Only)       Balance Overall balance  assessment: Needs assistance   Sitting balance-Leahy Scale: Fair       Standing balance-Leahy Scale: Poor                               Pertinent Vitals/Pain Pain Assessment: 0-10 Faces Pain Scale: Hurts little more Pain Descriptors / Indicators: Discomfort Pain Intervention(s): Premedicated before session;Monitored during session;Limited activity within patient's tolerance    Home Living Family/patient expects to be discharged to:: Private residence   Available Help at Discharge: Family;Available 24 hours/day Type of Home: House Home Access: Stairs to enter Entrance Stairs-Rails: Can reach both Entrance Stairs-Number of Steps: 3 Home Layout: One level Home Equipment: None      Prior Function Level of Independence: Independent               Hand Dominance        Extremity/Trunk Assessment   Upper Extremity Assessment Upper Extremity Assessment: Generalized weakness    Lower Extremity Assessment Lower Extremity Assessment: Generalized weakness(LLE)       Communication   Communication: No difficulties  Cognition Arousal/Alertness: Awake/alert Behavior During Therapy: WFL for tasks assessed/performed Overall Cognitive Status: Within Functional Limits for tasks assessed  General Comments      Exercises General Exercises - Lower Extremity Ankle Circles/Pumps: Right;10 reps Long Arc Quad: 10 reps Hip ABduction/ADduction: 10 reps Straight Leg Raises: 10 reps   Assessment/Plan    PT Assessment Patient needs continued PT services  PT Problem List Decreased strength;Decreased range of motion;Decreased activity tolerance;Decreased balance;Decreased mobility;Decreased coordination;Decreased cognition;Pain       PT Treatment Interventions DME instruction;Stair training;Gait training;Functional mobility training;Therapeutic activities;Therapeutic exercise;Balance training    PT Goals  (Current goals can be found in the Care Plan section)  Acute Rehab PT Goals Patient Stated Goal: get stronger PT Goal Formulation: With patient Time For Goal Achievement: 08/03/18 Potential to Achieve Goals: Good    Frequency Min 3X/week   Barriers to discharge        Co-evaluation               AM-PAC PT "6 Clicks" Daily Activity  Outcome Measure Difficulty turning over in bed (including adjusting bedclothes, sheets and blankets)?: Unable Difficulty moving from lying on back to sitting on the side of the bed? : Unable Difficulty sitting down on and standing up from a chair with arms (e.g., wheelchair, bedside commode, etc,.)?: Unable Help needed moving to and from a bed to chair (including a wheelchair)?: Total Help needed walking in hospital room?: Total Help needed climbing 3-5 steps with a railing? : Total 6 Click Score: 6    End of Session Equipment Utilized During Treatment: Gait belt Activity Tolerance: Patient tolerated treatment well Patient left: in bed;with call bell/phone within reach;with family/visitor present Nurse Communication: Mobility status PT Visit Diagnosis: Unsteadiness on feet (R26.81);Other abnormalities of gait and mobility (R26.89);Muscle weakness (generalized) (M62.81);Difficulty in walking, not elsewhere classified (R26.2);Pain Pain - Right/Left: Left Pain - part of body: Ankle and joints of foot    Time: 1610-9604 PT Time Calculation (min) (ACUTE ONLY): 31 min   Charges:   PT Evaluation $PT Eval Moderate Complexity: 1 Mod PT Treatments $Therapeutic Activity: 8-22 mins       Etta Grandchild, PT, DPT Acute Rehabilitation Services Pager: [phone] Office: [phone]    Etta Grandchild 07/20/2018, 3:45 PM

## 2018-07-20 NOTE — Progress Notes (Signed)
Rehab Admissions Coordinator Note:  Patient was screened by Clois Dupes for appropriateness for an Inpatient Acute Rehab Consult per PT recommendation.  At this time, we are recommending Inpatient Rehab consult. Please place order for consult if pt/family would like be to be considered for admit. Please advise.  Ottie Glazier, RN, MSN Rehab Admissions Coordinator [phone] 07/20/2018 4:15 PM

## 2018-07-20 NOTE — Progress Notes (Signed)
Nutrition Follow-up  DOCUMENTATION CODES:   Not applicable  INTERVENTION:   Ensure Enlive po BID, each supplement provides 350 kcal and 20 grams of protein   NUTRITION DIAGNOSIS:   Inadequate oral intake related to acute illness as evidenced by NPO status.  Being addressed as diet advanced, supplements  GOAL:   Patient will meet greater than or equal to 90% of their needs  Progressing  MONITOR:   PO intake, Supplement acceptance, Labs, Weight trends  REASON FOR ASSESSMENT:   Ventilator    ASSESSMENT:   22 yo male, admitted for respiratory failure. Suspected renal failure d/t dehydration and narcotic accumulation. No significant PMHx aside from recent foot fracture.   10/06 Admitted with respiratory failure, renal failure, shock liver; Intubated 10/08 Vomiting x 2 10/09 Extubated  Diet advanced to Regular, no recorded po intake yet  No weight since 10/07. Recommend re-weighing   Labs: potassium 3.4 Meds: folic acid, thiamine  Diet Order:   Diet Order            Diet regular Room service appropriate? Yes; Fluid consistency: Thin  Diet effective now              EDUCATION NEEDS:   Not appropriate for education at this time  Skin:  Skin Assessment: Skin Integrity Issues: Skin Integrity Issues:: DTI DTI: ear  Last BM:  10/10  Height:   Ht Readings from Last 1 Encounters:  07/17/18 5' 9.5" (1.765 m)    Weight:   Wt Readings from Last 1 Encounters:  07/17/18 90.7 kg    Ideal Body Weight:  74.1 kg  BMI:  Body mass index is 29.11 kg/m.  Estimated Nutritional Needs:   Kcal:  2200-2400 kcals   Protein:  110-120 g  Fluid:  >/= 2 L   Romelle Starcher MS, RD, LDN, CNSC [phone] Pager  [phone] Weekend/On-Call Pager

## 2018-07-20 NOTE — Progress Notes (Signed)
Telemetry called RN to notify that the patient was off the monitor, RN went in to find patient sitting on the floor in the bathroom. The patient stated that he got him self out of bed, slid himself from the bed to the bathroom on the floor because he had to urinate. There was a urine trail on the floor from the bed to the bathroom. No family was present in the room, no one had notified staff that everyone was leaving and patient was left alone. The patient was assisted off the floor with 2 RNs, bathed, bed linens changed, assisted back to bed, bed alarm placed on patient, and patient re-educated on need to use the call bell and not to get out off bed by himself. Also educated on bed alarm being used now. Covering Physician notified by text page.

## 2018-07-20 NOTE — Progress Notes (Signed)
NAME:  Randy Mcintyre, MRN:  [id number], DOB:  03-24-1996, LOS: 4 ADMISSION DATE:  07/16/2018, CONSULTATION DATE:  07/16/2018 REFERRING MD:  EDP - Long, CHIEF COMPLAINT:  Obtunded   Brief History   22 year old previously active male who fell from a deerstand 07/08/18 and fractured his right foot.  Patient was given mobic, norco and neurontin.  Patient was found lethargic at home and was left to rest.  3 hours later was found unresponsive and EMS was called.  Narcan was given without effect.  Patient was brought to the ER 10/6, where he was intubated and a pill fragment was found in the back of his throat.  PCCM was called to admit.  CT of the chest negative for PE and head CT was negative.  UDS + benzo, opiates, cocaine, THC  Past Medical History  None, possible poly-substance abuse  Significant Hospital Events   10/6 admission for respiratory failure, renal failure and shocked liver 10/8 vomiting x 2 overnight 10/9 Extubated  Consults: date of consult/date signed off & final recs:  Neurology Nephrology - signed off 10/7  Procedures (surgical and bedside):  ETT 10/6 >> 10/9 L IJ TLC 10/6>>> Foley 10/6 >> 10/10  Significant Diagnostic Tests:  Head CT 10/6>>>Negative Chest CT 10/6>>> negative for PE but there is LLL infiltrate EEG 10/6 >> no evidence active seizures, consistent with metabolic encephalopathy US renal 10/6 >> neg TTE 10/7 >> Mildly dilated LV with EF 45-50%, diffuse hypokinesis. Mildly dilated RV with normal systolic function. Borderline pulmonary hypertension. No significant valvular abnormalities.  Micro Data:  Blood 10/6>>> ngtd Sputum 10/6>>> MRSA 10/6 MRSA PCR >> positive   Antimicrobials:  Vanc 10/6 >>   (stop date 10/12 for 7 total days) Aztreonam 10/6 >>10/8; 10/9 >>10/9  Subjective:  Stable on 3L O2 overnight tmax 101.7 Some agitation vs delirium overnight, received 2 doses of prn fentanyl Patient denies any pain or SOB, asking for something to drink    Objective   Blood pressure 121/67, pulse 95, temperature 99.5 F (37.5 C), resp. rate (!) 24, height 5' 9.5" (1.765 m), weight 90.7 kg, SpO2 96 %. CVP:  [0 mmHg-9 mmHg] 6 mmHg  Vent Mode: PSV;CPAP FiO2 (%):  [40 %] 40 % PEEP:  [5 cmH20] 5 cmH20 Pressure Support:  [5 cmH20] 5 cmH20   Intake/Output Summary (Last 24 hours) at 07/20/2018 0817 Last data filed at 07/20/2018 0600 Gross per 24 hour  Intake 1402.09 ml  Output 4541 ml  Net -3138.91 ml   Filed Weights   07/17/18 0936  Weight: 90.7 kg   Examination: General:  Young adult male lying in bed in NAD HEENT: MM pink/moist, pupils 3/reactive Neuro: initially slow to respond but then sustained, oriented x 3, MAE CV:  rrr, no m/r/g PULM: even/non-labored, lungs bilaterally clear, slightly diminished in bases GI: soft, non-tender, bs active  Extremities: warm/dry, +1 generalized edema, LLE wrapped/ cap refill remains brisk Skin: no rashes, scattered ecchymosis   Resolved Hospital Problem list   Circulatory shock: septic vs hypovolemic, resolved Hyperkalemia  Assessment & Plan:  22 year old male with no significant PMH other than the lower extremity fracture 9/28 from falls presented with acute encephalopathy and acute respiratory failure in the setting of polysubstance abuse.  Required ventilation for airway protection.  Found to have acute renal dysfunction, transaminitis suspected to be shock liver, now is resolving.  He remains hemodynamically with slow to improve encephalopathy.  Extubated 10/9.  VDRF due to airway protection, encephalopathy: -  CXR reviewed 10/10- stable  P:  Wean O2 for sats >92% Aggressive pulm hygiene w/IS, PT consult Advance diet as tolerated   MRSA / LLL aspiration pneumonia: Continue vanc per pharmacy - stop date 10/12 for total 7 days    Acute renal failure: Lab Results  Component Value Date   CREATININE 0.70 07/20/2018   CREATININE 0.75 07/19/2018   CREATININE 1.02 07/18/2018   Recent  Labs  Lab 07/18/18 0422 07/19/18 0335 07/20/18 0500  K 2.8* 3.2* 3.4*   D/c foley Monitor UOP  Hypokalemic with hypoMag KCL 10 runs x 4 Mag 2 gm now  Trend bmet/ mag  Transaminitis, suspect shock liver but concern for possible chronic changes given his thrombocytopenia, coagulopathy, ? ETOH hx  Tylenol level less than 10 Hepatitis panel was negative LFT/ PLT continue to improve P:  Continue daily thiamine/ folate LFTs in am   Coagulopathy, presumed hepatic Trend CBC No signs of bleeding    Acute encephalopathy, toxic metabolic-  resolved  Monitor   Recent left lower extremity trauma and fracture ortho following, surgery deferred given critical illness/ coagulopathy for now fentanyl 50 mcg q 2 prn pain ( avoiding tylenol and nsaids given liver/ AKI/ coags) Bowel regimen with narcs  Consider adding heparin SQ as plt count continues to improve  Vomiting - Resolved likely due to gagging/ sedation Advance diet as tolerated     Disposition / Summary of Today's Plan 07/20/18   If patient continues to do well, consider tx to SDU/ floor this afternoon Advance diet as tolerated Wean O2, OOB / PT consult D/c foley Add second PIV, consider d/c CVC as he remains hemodynamically stable and continues to clinically improve.    Pain/Anxiety/Delirium protocol (if indicated): prn fentanyl VAP protocol (if indicated):  DVT prophylaxis: SCD RLE, hold sq heparin till PLT > 100K GI prophylaxis: n/a Hyperglycemia protocol: SSI, CBG q 4 Mobility: BR, advance w/PT Code Status: Full  Family Communication: Patient and mother updated at bedside.   Labs   CBC: Recent Labs  Lab 07/16/18 1333 07/16/18 1338 07/17/18 0451 07/17/18 0545 07/18/18 0422 07/19/18 0905 07/20/18 0500  WBC 18.5*  --  6.7  --  5.9 6.0 6.6  NEUTROABS 15.1*  --   --   --   --   --   --   HGB 12.9* 13.6 10.5*  --  10.4* 10.4* 10.3*  HCT 41.3 40.0 32.9*  --  32.6* 32.6* 32.0*  MCV 95.6  --  93.7  --  93.1  93.1 90.4  PLT 114*  --  53* 53* 69* 97* 119*    Basic Metabolic Panel: Recent Labs  Lab 07/16/18 1535 07/16/18 1646  07/17/18 0002 07/17/18 0451 07/18/18 0422 07/19/18 0335 07/20/18 0500  NA  --  132*   < > 137 137 140 140 140  K  --  6.5*   < > 3.8 3.4* 2.8* 3.2* 3.4*  CL  --  107   < > 107 106 106 109 108  CO2  --  18*   < > 23 25 26 25 23   GLUCOSE  --  258*   < > 218* 144* 147* 119* 92  BUN  --  29*   < > 27* 25* 15 13 10   CREATININE  --  2.68*   < > 2.00* 1.65* 1.02 0.75 0.70  CALCIUM  --  6.7*   < > 7.2* 7.4* 8.0* 7.8* 8.8*  MG 2.0 2.0  --   --  2.1 1.9 1.6* 1.7  PHOS 5.0*  --   --   --  2.5  --  2.1* 4.6   < > = values in this interval not displayed.   GFR: Estimated Creatinine Clearance: 161.3 mL/min (by C-G formula based on SCr of 0.7 mg/dL). Recent Labs  Lab 07/16/18 1338 07/16/18 1525 07/16/18 1535 07/17/18 0451 07/18/18 0422 07/18/18 1036 07/19/18 0905 07/20/18 0500  PROCALCITON  --   --   --   --   --   --  5.97 3.41  WBC  --   --   --  6.7 5.9  --  6.0 6.6  LATICACIDVEN 3.84* 3.70* 4.2*  --   --  1.4  --   --     Liver Function Tests: Recent Labs  Lab 07/16/18 1333 07/17/18 0002 07/17/18 0451 07/18/18 1004 07/19/18 0335 07/20/18 0500  AST 2,640* 9,888* 9,812* 5,005* 1,805*  --   ALT 3,096* 4,628* 4,776* 5,262* 3,032*  --   ALKPHOS 56 41 43 48 47  --   BILITOT 1.1 1.3* 1.2 1.9* 1.8*  --   PROT 6.2* 4.7* 4.8* 4.5* 4.6*  --   ALBUMIN 3.4* 2.5* 2.5* 2.3* 2.4* 2.8*   Recent Labs  Lab 07/16/18 1333  LIPASE 34   Recent Labs  Lab 07/16/18 1646  AMMONIA 47*    ABG    Component Value Date/Time   PHART 7.417 07/17/2018 0330   PCO2ART 37.9 07/17/2018 0330   PO2ART 112 (H) 07/17/2018 0330   HCO3 23.9 07/17/2018 0330   TCO2 19 (L) 07/16/2018 2028   ACIDBASEDEF 0.0 07/17/2018 0330   O2SAT 98.2 07/17/2018 0330     Coagulation Profile: Recent Labs  Lab 07/16/18 1535 07/17/18 0545 07/18/18 0422 07/19/18 0335  INR 1.82 1.87 1.76 1.42     Cardiac Enzymes: Recent Labs  Lab 07/16/18 1535 07/17/18 0451 07/18/18 0422 07/18/18 1004 07/19/18 0335  CKTOTAL 1,173* 2,820* 2,289*  --   --   TROPONINI  --   --  8.50* 6.41* 2.32*    HbA1C: No results found for: HGBA1C  CBG: Recent Labs  Lab 07/19/18 1151 07/19/18 1549 07/19/18 2006 07/20/18 0022 07/20/18 0439  GLUCAP 102* 99 78 79 83   Posey Boyer, AGACNP-BC [facility] Pgr: [phone] or if no answer [phone] 07/20/2018, 8:17 AM

## 2018-07-21 LAB — CBC
HEMATOCRIT: 34.8 % — AB (ref 39.0–52.0)
Hemoglobin: 11.3 g/dL — ABNORMAL LOW (ref 13.0–17.0)
MCH: 28.8 pg (ref 26.0–34.0)
MCHC: 32.5 g/dL (ref 30.0–36.0)
MCV: 88.8 fL (ref 80.0–100.0)
PLATELETS: 134 10*3/uL — AB (ref 150–400)
RBC: 3.92 MIL/uL — ABNORMAL LOW (ref 4.22–5.81)
RDW: 12.3 % (ref 11.5–15.5)
WBC: 8.4 10*3/uL (ref 4.0–10.5)
nRBC: 0 % (ref 0.0–0.2)

## 2018-07-21 LAB — HEPATIC FUNCTION PANEL
ALT: 1667 U/L — ABNORMAL HIGH (ref 0–44)
AST: 216 U/L — ABNORMAL HIGH (ref 15–41)
Albumin: 3.1 g/dL — ABNORMAL LOW (ref 3.5–5.0)
Alkaline Phosphatase: 57 U/L (ref 38–126)
BILIRUBIN TOTAL: 2 mg/dL — AB (ref 0.3–1.2)
Bilirubin, Direct: 0.5 mg/dL — ABNORMAL HIGH (ref 0.0–0.2)
Indirect Bilirubin: 1.5 mg/dL — ABNORMAL HIGH (ref 0.3–0.9)
Total Protein: 6 g/dL — ABNORMAL LOW (ref 6.5–8.1)

## 2018-07-21 LAB — RENAL FUNCTION PANEL
ALBUMIN: 3.1 g/dL — AB (ref 3.5–5.0)
Anion gap: 11 (ref 5–15)
BUN: 12 mg/dL (ref 6–20)
CALCIUM: 9.1 mg/dL (ref 8.9–10.3)
CO2: 23 mmol/L (ref 22–32)
CREATININE: 0.74 mg/dL (ref 0.61–1.24)
Chloride: 106 mmol/L (ref 98–111)
Glucose, Bld: 95 mg/dL (ref 70–99)
PHOSPHORUS: 4.6 mg/dL (ref 2.5–4.6)
Potassium: 3.3 mmol/L — ABNORMAL LOW (ref 3.5–5.1)
SODIUM: 140 mmol/L (ref 135–145)

## 2018-07-21 LAB — DRUG PROFILE, UR, 9 DRUGS (LABCORP)
AMPHETAMINES, URINE: NEGATIVE ng/mL
Barbiturate, Ur: NEGATIVE ng/mL
Benzodiazepine Quant, Ur: POSITIVE ng/mL — AB
Cannabinoid Quant, Ur: POSITIVE ng/mL — AB
Cocaine (Metab.): NEGATIVE ng/mL
METHADONE SCREEN, URINE: NEGATIVE ng/mL
OPIATE QUANT UR: NEGATIVE ng/mL
PHENCYCLIDINE, UR: NEGATIVE ng/mL
PROPOXYPHENE, URINE: NEGATIVE ng/mL

## 2018-07-21 LAB — CULTURE, BLOOD (ROUTINE X 2): CULTURE: NO GROWTH

## 2018-07-21 LAB — MAGNESIUM: MAGNESIUM: 2 mg/dL (ref 1.7–2.4)

## 2018-07-21 MED ORDER — ONDANSETRON HCL 4 MG/2ML IJ SOLN
4.0000 mg | Freq: Four times a day (QID) | INTRAMUSCULAR | Status: DC | PRN
  Administered 2018-07-22 – 2018-07-25 (×2): 4 mg via INTRAVENOUS
  Filled 2018-07-21 (×2): qty 2

## 2018-07-21 MED ORDER — SODIUM CHLORIDE 0.9 % IV SOLN
1.0000 g | Freq: Three times a day (TID) | INTRAVENOUS | Status: DC
  Administered 2018-07-21: 1 g via INTRAVENOUS
  Filled 2018-07-21 (×2): qty 1

## 2018-07-21 MED ORDER — METRONIDAZOLE IN NACL 5-0.79 MG/ML-% IV SOLN
500.0000 mg | Freq: Three times a day (TID) | INTRAVENOUS | Status: DC
  Administered 2018-07-21 – 2018-07-22 (×3): 500 mg via INTRAVENOUS
  Filled 2018-07-21 (×3): qty 100

## 2018-07-21 MED ORDER — POTASSIUM CHLORIDE CRYS ER 20 MEQ PO TBCR
40.0000 meq | EXTENDED_RELEASE_TABLET | Freq: Once | ORAL | Status: AC
  Administered 2018-07-21: 40 meq via ORAL
  Filled 2018-07-21: qty 2

## 2018-07-21 MED ORDER — TRAMADOL HCL 50 MG PO TABS
50.0000 mg | ORAL_TABLET | Freq: Four times a day (QID) | ORAL | Status: DC | PRN
  Administered 2018-07-21 – 2018-07-27 (×3): 50 mg via ORAL
  Filled 2018-07-21 (×3): qty 1

## 2018-07-21 MED ORDER — OXYCODONE HCL 5 MG PO TABS
5.0000 mg | ORAL_TABLET | ORAL | Status: DC | PRN
  Administered 2018-07-21 – 2018-07-27 (×5): 5 mg via ORAL
  Filled 2018-07-21 (×5): qty 1

## 2018-07-21 NOTE — Progress Notes (Signed)
Physical Therapy Treatment Patient Details Name: Randy Mcintyre MRN: [id number] DOB: 1996-02-04 Today's Date: 07/21/2018    History of Present Illness 22 y.o. male admitted 10/06 with unresponsiveness, acute renal failure, transaminitis. Pt was awaiting surgery for recent fall and L foot fracture.  Pt found next to empty bottle of painkillers, upon admission required intubation 10/06, extubated 10/09 without complications.     PT Comments    Patient progressing well with therapy today, able to participate safely in transfers to bedside chair. improved balance and more alert. Feel patient could benefit from SLP evaluation for cognition.  Currently min A for standing and transfers, will likely progress to short distance ambulation next visit.     Follow Up Recommendations  CIR     Equipment Recommendations  (TBD)    Recommendations for Other Services Speech consult(SLP for cognition)     Precautions / Restrictions Precautions Precautions: Fall Restrictions Weight Bearing Restrictions: Yes LLE Weight Bearing: Non weight bearing    Mobility  Bed Mobility Overal bed mobility: Needs Assistance Bed Mobility: Supine to Sit     Supine to sit: Min assist        Transfers Overall transfer level: Needs assistance Equipment used: Rolling walker (2 wheeled) Transfers: Sit to/from Stand Sit to Stand: Min assist         General transfer comment: min A to stand, cues for NWB, pt with improved balance prior to d/c yesterday  Ambulation/Gait                 Stairs             Wheelchair Mobility    Modified Rankin (Stroke Patients Only)       Balance Overall balance assessment: Needs assistance   Sitting balance-Leahy Scale: Fair       Standing balance-Leahy Scale: Poor                              Cognition Arousal/Alertness: Awake/alert Behavior During Therapy: Flat affect                                   General  Comments: slow processing       Exercises      General Comments        Pertinent Vitals/Pain Pain Assessment: Faces Faces Pain Scale: Hurts little more Pain Location: R shoulder Pain Descriptors / Indicators: Sore Pain Intervention(s): Limited activity within patient's tolerance;Monitored during session;Premedicated before session    Home Living Family/patient expects to be discharged to:: Private residence Living Arrangements: Spouse/significant other;Children Available Help at Discharge: Family;Available 24 hours/day Type of Home: House Home Access: Stairs to enter Entrance Stairs-Rails: Can reach both Home Layout: One level Home Equipment: None      Prior Function Level of Independence: Independent          PT Goals (current goals can now be found in the care plan section) Acute Rehab PT Goals PT Goal Formulation: With patient Time For Goal Achievement: 08/03/18 Potential to Achieve Goals: Good Progress towards PT goals: Progressing toward goals    Frequency    Min 3X/week      PT Plan Current plan remains appropriate    Co-evaluation PT/OT/SLP Co-Evaluation/Treatment: Yes Reason for Co-Treatment: For patient/therapist safety          AM-PAC PT "6 Clicks" Daily Activity  Outcome Measure  Difficulty  turning over in bed (including adjusting bedclothes, sheets and blankets)?: A Lot Difficulty moving from lying on back to sitting on the side of the bed? : A Lot Difficulty sitting down on and standing up from a chair with arms (e.g., wheelchair, bedside commode, etc,.)?: A Lot Help needed moving to and from a bed to chair (including a wheelchair)?: A Lot Help needed walking in hospital room?: A Lot Help needed climbing 3-5 steps with a railing? : Total 6 Click Score: 11    End of Session Equipment Utilized During Treatment: Gait belt Activity Tolerance: Patient tolerated treatment well Patient left: in bed;with call bell/phone within reach;with  family/visitor present Nurse Communication: Mobility status PT Visit Diagnosis: Unsteadiness on feet (R26.81);Other abnormalities of gait and mobility (R26.89);Muscle weakness (generalized) (M62.81);Difficulty in walking, not elsewhere classified (R26.2);Pain Pain - Right/Left: Left Pain - part of body: Ankle and joints of foot     Time: 1005-1030 PT Time Calculation (min) (ACUTE ONLY): 25 min  Charges:  $Therapeutic Activity: 8-22 mins                     Etta Grandchild, PT, DPT Acute Rehabilitation Services Pager: [phone] Office: [phone]     Etta Grandchild 07/21/2018, 12:49 PM

## 2018-07-21 NOTE — Consult Note (Signed)
Physical Medicine and Rehabilitation Consult Reason for Consult: Decreased functional mobility Referring Physician: [facility]   HPI: Randy Mcintyre is a 22 y.o. right-handed male with history of alcohol and tobacco use as well as recent right foot fracture after a fall from a 30 foot deer stand  and was awaiting surgery.  Per chart review patient lives with fianc.  Independent prior to admission.  Good support of local family.  Presented 07/16/2018 after being found unresponsive with an empty bottle of painkillers.  Patient required intubation.  Cranial CT scan negative.  CT angiogram of chest negative.  Findings of elevated creatinine 3.24 as well as hyperkalemia 7.3, ammonia 47.  Urine drug screen positive for cocaine as well as marijuana.  Renal ultrasound with no hydronephrosis.  EEG negative for seizure.  Echocardiogram with ejection fraction of 50% grade 2 diastolic dysfunction.  Patient was extubated 07/19/2018.  Subtends Lovenox for DVT prophylaxis.  Diet has been advanced to regular.  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  Awaiting orthopedic services plan for left foot surgery.  Patient currently nonweightbearing left lower extremity.  Therapy evaluation completed with recommendations of physical medicine rehab consult.   Review of Systems  Constitutional: Negative for chills and fever.  HENT: Negative for hearing loss.   Eyes: Negative for blurred vision and double vision.  Respiratory: Negative for cough and shortness of breath.   Cardiovascular: Negative for chest pain and palpitations.  Gastrointestinal: Positive for constipation. Negative for nausea and vomiting.  Genitourinary: Negative for dysuria, flank pain and hematuria.  Musculoskeletal: Positive for joint pain.  Skin: Negative for rash.  Neurological: Negative for seizures.  Psychiatric/Behavioral:       Anxiety  All other systems reviewed and are negative.  Past Medical History:  Diagnosis Date  . Anxiety   . Complication of  anesthesia 07/08/2018   pation had a reducation and splitting in ED- "they said that it took 3 times the amount it should have."  . Medical history non-contributory    History reviewed. No pertinent surgical history. History reviewed. No pertinent family history. Social History:  reports that he has been smoking. He has smoked for the past 7.00 years. His smokeless tobacco use includes chew. He reports that he drinks about 24.0 standard drinks of alcohol per week. He reports that he has current or past drug history. Drug: Marijuana. Allergies:  Allergies  Allergen Reactions  . Penicillins Other (See Comments)    Severe mouth ulcers Has patient had a PCN reaction causing immediate rash, facial/tongue/throat swelling, SOB or lightheadedness with hypotension: No HAS PT DEVELOPED SEVERE RASH INVOLVING MUCUS MEMBRANES or SKIN NECROSIS:  YES SEVERE ORAL ULCERS. Has patient had a PCN reaction that required hospitalization: No Has patient had a PCN reaction occurring within the last 10 years: Yes If all of the above answers are "NO", then may proceed with Cephalosporin use.   Medications Prior to Admission  Medication Sig Dispense Refill  . gabapentin (NEURONTIN) 300 MG capsule Take 300 mg by mouth at bedtime.    Marland Kitchen HYDROcodone-acetaminophen (NORCO/VICODIN) 5-325 MG tablet Take 1 tablet by mouth every 4 (four) hours as needed for up to 24 doses. (Patient taking differently: Take 1 tablet by mouth every 4 (four) hours as needed (pain). ) 10 tablet 0  . meloxicam (MOBIC) 15 MG tablet Take 15 mg by mouth daily.    . polyethylene glycol (MIRALAX / GLYCOLAX) packet Take 17 g by mouth daily.    Marland Kitchen ibuprofen (ADVIL,MOTRIN) 200 MG  tablet Take 600 mg by mouth every 6 (six) hours as needed for headache (pain).      Home: Home Living Family/patient expects to be discharged to:: Private residence Living Arrangements: Spouse/significant other, Children Available Help at Discharge: Family, Available 24  hours/day Type of Home: House Home Access: Stairs to enter Secretary/administrator of Steps: 3 Entrance Stairs-Rails: Can reach both Home Layout: One level Bathroom Shower/Tub: Engineer, manufacturing systems: Standard Home Equipment: None  Functional History: Prior Function Level of Independence: Independent Functional Status:  Mobility: Bed Mobility Overal bed mobility: Needs Assistance Bed Mobility: Supine to Sit Supine to sit: Min assist Transfers Overall transfer level: Needs assistance Equipment used: Rolling walker (2 wheeled) Transfers: Sit to/from Stand Sit to Stand: Min assist General transfer comment: Min A to stand, cues for NWB, patient able to stand with support of RW and min A to provide stability, posterior lean noted and unable to take steps yet due to weakness. pt denies dizziness, VSS      ADL:    Cognition: Cognition Overall Cognitive Status: Within Functional Limits for tasks assessed Orientation Level: Oriented X4 Cognition Arousal/Alertness: Awake/alert Behavior During Therapy: Flat affect Overall Cognitive Status: Within Functional Limits for tasks assessed  Blood pressure 107/62, pulse 95, temperature 98.5 F (36.9 C), temperature source Oral, resp. rate 16, height 5' 9.5" (1.765 m), weight 90.7 kg, SpO2 96 %. Physical Exam  Vitals reviewed. Constitutional: He appears well-developed.  HENT:  Head: Normocephalic.  Eyes: EOM are normal.  Cardiovascular: Normal rate.  Respiratory: Effort normal.  GI: Soft.  Neurological:  Patient is alert.  Follows full commands.  He was able to recall his recent fall from a deer stand.  Skin:  Right lower extremity with heavy splint and dressing in place    Results for orders placed or performed during the hospital encounter of 07/16/18 (from the past 24 hour(s))  Glucose, capillary     Status: Abnormal   Collection Time: 07/20/18 12:17 PM  Result Value Ref Range   Glucose-Capillary 102 (H) 70 - 99  mg/dL  Renal function panel     Status: Abnormal   Collection Time: 07/21/18  5:55 AM  Result Value Ref Range   Sodium 140 135 - 145 mmol/L   Potassium 3.3 (L) 3.5 - 5.1 mmol/L   Chloride 106 98 - 111 mmol/L   CO2 23 22 - 32 mmol/L   Glucose, Bld 95 70 - 99 mg/dL   BUN 12 6 - 20 mg/dL   Creatinine, Ser 1.61 0.61 - 1.24 mg/dL   Calcium 9.1 8.9 - 09.6 mg/dL   Phosphorus 4.6 2.5 - 4.6 mg/dL   Albumin 3.1 (L) 3.5 - 5.0 g/dL   GFR calc non Af Amer >60 >60 mL/min   GFR calc Af Amer >60 >60 mL/min   Anion gap 11 5 - 15  Magnesium     Status: None   Collection Time: 07/21/18  5:55 AM  Result Value Ref Range   Magnesium 2.0 1.7 - 2.4 mg/dL  CBC     Status: Abnormal   Collection Time: 07/21/18  5:55 AM  Result Value Ref Range   WBC 8.4 4.0 - 10.5 K/uL   RBC 3.92 (L) 4.22 - 5.81 MIL/uL   Hemoglobin 11.3 (L) 13.0 - 17.0 g/dL   HCT 04.5 (L) 40.9 - 81.1 %   MCV 88.8 80.0 - 100.0 fL   MCH 28.8 26.0 - 34.0 pg   MCHC 32.5 30.0 - 36.0 g/dL  RDW 12.3 11.5 - 15.5 %   Platelets 134 (L) 150 - 400 K/uL   nRBC 0.0 0.0 - 0.2 %  Hepatic function panel     Status: Abnormal   Collection Time: 07/21/18  5:55 AM  Result Value Ref Range   Total Protein 6.0 (L) 6.5 - 8.1 g/dL   Albumin 3.1 (L) 3.5 - 5.0 g/dL   AST 119 (H) 15 - 41 U/L   ALT 1,667 (H) 0 - 44 U/L   Alkaline Phosphatase 57 38 - 126 U/L   Total Bilirubin 2.0 (H) 0.3 - 1.2 mg/dL   Bilirubin, Direct 0.5 (H) 0.0 - 0.2 mg/dL   Indirect Bilirubin 1.5 (H) 0.3 - 0.9 mg/dL   Dg Chest Port 1 View  Result Date: 07/20/2018 CLINICAL DATA:  Respiratory failure, smoker EXAM: PORTABLE CHEST 1 VIEW COMPARISON:  Portable exam 0519 hours compared to 07/19/2018 FINDINGS: Interval removal of nasogastric and endotracheal tubes. Tip of LEFT jugular central venous catheter projects over SVC at the level of the azygos arch. Enlargement of cardiac silhouette. Prominent mediastinum likely accentuated by technique. Decreased lung volumes with bibasilar  atelectasis. No gross pleural effusion or pneumothorax. IMPRESSION: Enlargement of cardiac silhouette with low lung volumes and bibasilar atelectasis. Electronically Signed   By: Ulyses Southward M.D.   On: 07/20/2018 10:45     Assessment/Plan: Diagnosis: multiple left foot fractures after fall, debility after unintentional OD/respiratory failure 1. Does the need for close, 24 hr/day medical supervision in concert with the patient's rehab needs make it unreasonable for this patient to be served in a less intensive setting? Yes 2. Co-Morbidities requiring supervision/potential complications: pain, wound care 3. Due to bladder management, bowel management, safety, skin/wound care, disease management, medication administration, pain management and patient education, does the patient require 24 hr/day rehab nursing? Yes 4. Does the patient require coordinated care of a physician, rehab nurse, PT (1-2 hrs/day, 5 days/week), OT (1-2 hrs/day, 5 days/week) and SLP (1-2 hrs/day, 5 days/week) to address physical and functional deficits in the context of the above medical diagnosis(es)? Yes and Potentially Addressing deficits in the following areas: balance, endurance, locomotion, strength, transferring, bowel/bladder control, bathing, dressing, feeding, grooming, toileting, cognition and psychosocial support 5. Can the patient actively participate in an intensive therapy program of at least 3 hrs of therapy per day at least 5 days per week? Yes 6. The potential for patient to make measurable gains while on inpatient rehab is good 7. Anticipated functional outcomes upon discharge from inpatient rehab are modified independent  with PT, modified independent with OT, modified independent with SLP. 8. Estimated rehab length of stay to reach the above functional goals is: potentially 1 week 9. Anticipated D/C setting: Home 10. Anticipated post D/C treatments: HH therapy 11. Overall Rehab/Functional Prognosis:  excellent  RECOMMENDATIONS: This patient's condition is appropriate for continued rehabilitative care in the following setting: potentially CIR Patient has agreed to participate in recommended program. Potentially Note that insurance prior authorization may be required for reimbursement for recommended care.  Comment: Pt with foot surgery planned for ?Tuesday. Consider brief inpatient rehab stay depending on his functional needs post-op. Rehab Admissions Coordinator to follow up.  Thanks,  Ranelle Oyster, MD, [place] [facility]    Mcarthur Rossetti Angiulli, PA-C 07/21/2018

## 2018-07-21 NOTE — Progress Notes (Signed)
PHARMACY - PHYSICIAN COMMUNICATION CRITICAL VALUE ALERT - BLOOD CULTURE IDENTIFICATION (BCID)  Randy Mcintyre is an 22 y.o. male who presented to [facility] on 07/16/2018 with a chief complaint of unresponsive/acute renal failure  Blood culture 10/6: Gram Positive Rods (these are not ran through BCID)  Assessment:  Known MRSA PNA  Name of physician (or Provider) Contacted: Dr. Darrick Penna  Current antibiotics: Vancomycin   Changes to prescribed antibiotics recommended:  Likely contaminant, no changes for now  No results found for this or any previous visit.  [name] 07/21/2018  12:31 AM

## 2018-07-21 NOTE — Progress Notes (Addendum)
Patient has had good clinical improvement.  He has been extubated and transferred out of the ICU.  We will look for time to perform left foot surgery early next week, hopefully Tuesday morning.  If he is to be discharged from the hospital prior to that he will need to have his surgery scheduled as an outpatient.  We will update this note with definitive surgery time once established.  Have case posted for 10am on Tuesday 07/25/18.

## 2018-07-21 NOTE — Evaluation (Addendum)
Occupational Therapy Evaluation Patient Details Name: Randy Mcintyre MRN: [id number] DOB: 1996/10/07 Today's Date: 07/21/2018    History of Present Illness 22 y.o. male admitted 10/06 with unresponsiveness, acute renal failure, transaminitis. Pt was awaiting surgery for recent fall and L foot fracture.  Pt found next to empty bottle of painkillers, upon admission required intubation 10/06, extubated 10/09 without complications.    Clinical Impression   Pt admitted with the above diagnoses and presents with below problem list. Pt will benefit from continued acute OT to address the below listed deficits and maximize independence with basic ADLs prior to d/c to venue below. PTA pt was independent with ADLs. Pt is currently min to mod A with LB ADLs and functional transfers. Feel pt would benefit from Speech Therapy consult to further assess cognition.      Follow Up Recommendations  CIR    Equipment Recommendations  Other (comment)(defer to next venue)    Recommendations for Other Services Speech consult(cognition)     Precautions / Restrictions Precautions Precautions: Fall Restrictions Weight Bearing Restrictions: Yes LLE Weight Bearing: Non weight bearing      Mobility Bed Mobility Overal bed mobility: Needs Assistance Bed Mobility: Supine to Sit     Supine to sit: Min assist        Transfers Overall transfer level: Needs assistance Equipment used: Rolling walker (2 wheeled) Transfers: Sit to/from Stand Sit to Stand: Min assist         General transfer comment: min A to stand, cues for NWB and technique with rw,    Balance Overall balance assessment: Needs assistance Sitting-balance support: Bilateral upper extremity supported;Feet supported Sitting balance-Leahy Scale: Fair       Standing balance-Leahy Scale: Poor                             ADL either performed or assessed with clinical judgement   ADL Overall ADL's : Needs  assistance/impaired Eating/Feeding: Set up;Sitting   Grooming: Oral care;Set up;Sitting Grooming Details (indicate cue type and reason): increased about of time to complete oral care tasks.  Upper Body Bathing: Set up;Sitting   Lower Body Bathing: Moderate assistance;Sit to/from stand   Upper Body Dressing : Set up;Sitting   Lower Body Dressing: Moderate assistance;Sit to/from stand   Toilet Transfer: Minimal assistance;Stand-pivot;BSC;RW   Toileting- Clothing Manipulation and Hygiene: Moderate assistance;Sit to/from stand   Tub/ Shower Transfer: Minimal assistance;Stand-pivot;Ambulation;3 in 1;Rolling walker     General ADL Comments: Pt completed bed mobility and SPT EOB>recliner. Pt then completed oral care.      Vision         Perception     Praxis      Pertinent Vitals/Pain Pain Assessment: Faces Faces Pain Scale: Hurts little more Pain Location: R shoulder Pain Descriptors / Indicators: Sore Pain Intervention(s): Limited activity within patient's tolerance;Monitored during session;Premedicated before session     Hand Dominance     Extremity/Trunk Assessment Upper Extremity Assessment Upper Extremity Assessment: Generalized weakness   Lower Extremity Assessment Lower Extremity Assessment: Defer to PT evaluation       Communication Communication Communication: No difficulties   Cognition Arousal/Alertness: Awake/alert Behavior During Therapy: Flat affect Overall Cognitive Status: Impaired/Different from baseline Area of Impairment: Orientation;Safety/judgement;Attention;Awareness;Problem solving                 Orientation Level: Time Current Attention Level: Sustained     Safety/Judgement: Decreased awareness of safety;Decreased awareness of deficits Awareness: Intellectual  Problem Solving: Slow processing General Comments: per chart review pt crawled out of bed overnight and into the bathroom. +2 assist to help him off the floor.     General Comments       Exercises     Shoulder Instructions      Home Living Family/patient expects to be discharged to:: Private residence Living Arrangements: Spouse/significant other;Children Available Help at Discharge: Family;Available 24 hours/day Type of Home: House Home Access: Stairs to enter [employer] of Steps: 3 Entrance Stairs-Rails: Can reach both Home Layout: One level     Bathroom Shower/Tub: Chief Strategy Officer: Standard     Home Equipment: None          Prior Functioning/Environment Level of Independence: Independent        Comments: works in Product/process development scientist Problem List: Decreased activity tolerance;Impaired balance (sitting and/or standing);Decreased cognition;Decreased safety awareness;Decreased knowledge of use of DME or AE;Decreased knowledge of precautions;Pain      OT Treatment/Interventions: Self-care/ADL training;Therapeutic exercise;DME and/or AE instruction;Therapeutic activities;Cognitive remediation/compensation;Patient/family education;Balance training    OT Goals(Current goals can be found in the care plan section) Acute Rehab OT Goals Patient Stated Goal: get stronger OT Goal Formulation: With patient Time For Goal Achievement: 08/04/18 Potential to Achieve Goals: Good ADL Goals Pt Will Perform Lower Body Bathing: sit to/from stand;with supervision Pt Will Perform Lower Body Dressing: with supervision;sit to/from stand Pt Will Transfer to Toilet: with supervision;ambulating Pt Will Perform Toileting - Clothing Manipulation and hygiene: with supervision;sit to/from stand Pt Will Perform Tub/Shower Transfer: with supervision;ambulating;rolling walker;shower seat Additional ADL Goal #1: Pt will complete bed mobility at mod I level to prepare for OOB ADLs.  OT Frequency: Min 3X/week   Barriers to D/C:            Co-evaluation PT/OT/SLP Co-Evaluation/Treatment: Yes Reason for Co-Treatment:  For patient/therapist safety   OT goals addressed during session: ADL's and self-care      AM-PAC PT "6 Clicks" Daily Activity     Outcome Measure Help from another person eating meals?: None Help from another person taking care of personal grooming?: None Help from another person toileting, which includes using toliet, bedpan, or urinal?: A Lot Help from another person bathing (including washing, rinsing, drying)?: A Lot Help from another person to put on and taking off regular upper body clothing?: A Little Help from another person to put on and taking off regular lower body clothing?: A Lot 6 Click Score: 17   End of Session Equipment Utilized During Treatment: Gait belt;Rolling walker Nurse Communication: Mobility status;Other (comment)(cognition)  Activity Tolerance: Patient tolerated treatment well Patient left: in chair;with call bell/phone within reach;with chair alarm set  OT Visit Diagnosis: Unsteadiness on feet (R26.81);Muscle weakness (generalized) (M62.81);Other symptoms and signs involving cognitive function;Pain Pain - Right/Left: Right Pain - part of body: Shoulder                Time: 1005-1047 OT Time Calculation (min): 42 min Charges:  OT General Charges $OT Visit: 1 Visit OT Evaluation $OT Eval Moderate Complexity: 1 Mod  Raynald Kemp, OT Acute Rehabilitation Services Pager: [phone] Office: [phone]   Pilar Grammes 07/21/2018, 1:41 PM

## 2018-07-21 NOTE — Progress Notes (Signed)
[facility] Progress Note  Patient: Randy Mcintyre RUE:[id number]   PCP: Patient, No Pcp Per DOB: [birth year]   DOA: 07/16/2018   DOS: 07/21/2018   Date of Service: the patient was seen and examined on 07/21/2018  Brief hospital course: Pt. with PMH of substance abuse, alcohol use; admitted on 07/16/2018, presented with complaint of unresponsive event, was found to have acute toxic and metabolic encephalopathy drug overdose, unintentional, acute kidney injury as well as liver dysfunction.  Patient was admitted to the ICU, intubated for airway protection.  Work-up found left lower lobe infiltrate with MRSA pneumonia.  Extubated on 07/19/2018 and transferred to stepdown on 07/21/2018 Currently further plan is continue current care.  Subjective: Feeling better.  Minimal pain in his left ankle.  No chest pain no abdominal pain.  No vomiting.  Has some cough.  No diarrhea no constipation.  Had some nausea this morning which is all the time of my evaluation.  Assessment and Plan: 1.  Acute hypoxic respiratory failure. MRSA pneumonia. Acute metabolic and toxic encephalopathy. Patient is currently on room air. Needed intubation to protect his airway. ET aspirate grew MRSA.  Blood culture 1 out of 2 also grew gram-positive rods which appears to be contaminant but possible anaerobes as well. For now we will continue IV vancomycin as well as I added IV Flagyl today. Follow-up on results of the cultures. We will also send out repeat cultures today. Per CCM anticipating last day of IV antibiotics 07/22/2018, will continue with oral antibiotics for 3 more days following that.  2.  Acute metabolic and toxic encephalopathy. Presented with unresponsive event.  Needed intubation for airway protection. UDS is positive for positive for benzodiazepine, opiates, cocaine, marijuana. Patient was prescribed opiates and may have received benzodiazepine. Discussed with patient regarding cocaine and marijuana which he  says he is not aware how it showed up in his urine. Subsequent repeat drug toxicity scan is positive for marijuana but negative for cocaine metabolites on 07/17/2018. Likely initial event was a combination of patient's polysubstance abuse as well as use of narcotic pain medication and gabapentin. Currently patient is mentating better. Continue to monitor.  3.  Acute kidney injury. Baseline renal function normal. On presentation serum creatinine was 2.68.  CK was also elevated.  Repeat CK trending down. Current creatinine is normal. Patient is voiding okay as well. Monitor. Encourage oral hydration.  4.  Elevated LFT. AST peaked at 9888, ALT peaked at 4628. Subsequently trending down, AST currently 216, ALT currently 1667. Bilirubin total is 2.0. INR last checked 2 days ago was 1.4. Ultrasound abdomen negative for any acute abnormality. Hepatitis work-up is also negative. Tylenol level was not significantly elevated. Suspected shock liver. Continue to avoid hepatotoxic medications.  5.  Recent left lower extremity trauma and fracture. Fall from stand, seen by Ortho outpatient and was scheduled to go for surgery on 07/17/2018. Currently surgery scheduled next Tuesday. We need to make sure that the patient is hemodynamically as well as from liver and kidney perspective stable to go through the surgery.  6.  Elevated troponin. ST segment changes. Troponin peaked at 8.50. Checked 2 days ago while he was 2.32. EKG shows T wave inversions in anterior leads as well as inferior leads. Echocardiogram shows 44 to 50% EF. Diffuse hypokinesis. Likely in response to shock, but will discuss with cardiology to ensure no further work up is needed.   7.  Hypokalemia Hypomagnesemia Currently being replaced. We will recheck tomorrow.  8.  Thrombocytopenia. Acute,  platelet dropped to 53. Currently 134. Likely in response to sepsis.  Diet: Regular diet DVT Prophylaxis: subcutaneous  Heparin  Advance goals of care discussion: Full code  Family Communication: family was present at bedside, at the time of interview. The pt provided permission to discuss medical plan with the family. Opportunity was given to ask question and all questions were answered satisfactorily.   Disposition:  Discharge to CIR.  Consultants: primary admission with PCCM, CIR, orthopedic Procedures: Echocardiogram  EEG  Scheduled Meds: . chlorhexidine  15 mL Mouth Rinse BID  . docusate sodium  100 mg Oral BID  . enoxaparin (LOVENOX) injection  40 mg Subcutaneous Q24H  . feeding supplement (ENSURE ENLIVE)  237 mL Oral BID BM  . folic acid  1 mg Oral Daily  . mouth rinse  15 mL Mouth Rinse q12n4p  . potassium chloride  40 mEq Oral Once  . thiamine  100 mg Oral Daily   Continuous Infusions: . metronidazole    . vancomycin 1,000 mg (07/21/18 1416)   PRN Meds: bisacodyl, ondansetron (ZOFRAN) IV, oxyCODONE, traMADol Antibiotics: Anti-infectives (From admission, onward)   Start     Dose/Rate Route Frequency Ordered Stop   07/21/18 1630  metroNIDAZOLE (FLAGYL) IVPB 500 mg     500 mg 100 mL/hr over 60 Minutes Intravenous Every 8 hours 07/21/18 1528     07/21/18 1000  aztreonam (AZACTAM) 1 g in sodium chloride 0.9 % 100 mL IVPB  Status:  Discontinued     1 g 200 mL/hr over 30 Minutes Intravenous Every 8 hours 07/21/18 0908 07/21/18 1529   07/19/18 1600  vancomycin (VANCOCIN) IVPB 1000 mg/200 mL premix     1,000 mg 200 mL/hr over 60 Minutes Intravenous Every 6 hours 07/19/18 1359 07/23/18 0659   07/19/18 0900  aztreonam (AZACTAM) 1 g in sodium chloride 0.9 % 100 mL IVPB  Status:  Discontinued     1 g 200 mL/hr over 30 Minutes Intravenous Every 8 hours 07/19/18 0844 07/19/18 1348   07/18/18 0830  vancomycin (VANCOCIN) IVPB 1000 mg/200 mL premix  Status:  Discontinued     1,000 mg 200 mL/hr over 60 Minutes Intravenous Every 8 hours 07/18/18 0820 07/19/18 1359   07/17/18 1600  vancomycin  (VANCOCIN) 1,250 mg in sodium chloride 0.9 % 250 mL IVPB  Status:  Discontinued     1,250 mg 166.7 mL/hr over 90 Minutes Intravenous Every 24 hours 07/16/18 1436 07/17/18 0815   07/17/18 0900  vancomycin (VANCOCIN) IVPB 1000 mg/200 mL premix  Status:  Discontinued     1,000 mg 200 mL/hr over 60 Minutes Intravenous Every 12 hours 07/17/18 0815 07/18/18 0820   07/16/18 2300  aztreonam (AZACTAM) 1 g in sodium chloride 0.9 % 100 mL IVPB  Status:  Discontinued     1 g 200 mL/hr over 30 Minutes Intravenous Every 8 hours 07/16/18 1436 07/18/18 1508   07/16/18 1445  vancomycin (VANCOCIN) 1,750 mg in sodium chloride 0.9 % 500 mL IVPB     1,750 mg 250 mL/hr over 120 Minutes Intravenous  Once 07/16/18 1436 07/16/18 1844   07/16/18 1430  vancomycin (VANCOCIN) IVPB 1000 mg/200 mL premix  Status:  Discontinued     1,000 mg 200 mL/hr over 60 Minutes Intravenous  Once 07/16/18 1429 07/16/18 1436   07/16/18 1430  aztreonam (AZACTAM) 2 g in sodium chloride 0.9 % 100 mL IVPB     2 g 200 mL/hr over 30 Minutes Intravenous  Once 07/16/18 1429 07/17/18 0028  Objective: Physical Exam: Vitals:   07/20/18 2259 07/21/18 0330 07/21/18 0806 07/21/18 1642  BP: (!) 147/86 135/70 107/62 124/72  Pulse: (!) 105 (!) 102 95 (!) 101  Resp: 19 18 16 16   Temp: 98.6 F (37 C) 98.7 F (37.1 C) 98.5 F (36.9 C) 98.5 F (36.9 C)  TempSrc: Oral Oral Oral Oral  SpO2: 96% 92% 96% 97%  Weight:      Height:        Intake/Output Summary (Last 24 hours) at 07/21/2018 1715 Last data filed at 07/21/2018 1600 Gross per 24 hour  Intake 957.31 ml  Output 300 ml  Net 657.31 ml   Filed Weights   07/17/18 0936  Weight: 90.7 kg   General: Alert, Awake and Oriented to Time, Place and Person. Appear in mild distress, affect flat Eyes: PERRL, Conjunctiva normal ENT: Oral Mucosa clear moist. Neck: no JVD, no Abnormal Mass Or lumps Cardiovascular: S1 and S2 Present, no Murmur, Peripheral Pulses Present Respiratory:  normal respiratory effort, Bilateral Air entry equal and Decreased, no use of accessory muscle, Clear to Auscultation, no Crackles, no wheezes Abdomen: Bowel Sound present, Soft and no tenderness, no hernia Skin: no redness, no Rash, no induration Extremities: no Pedal edema, no calf tenderness Neurologic: Grossly no focal neuro deficit. Bilaterally Equal motor strength  Data Reviewed: CBC: Recent Labs  Lab 07/16/18 1333  07/17/18 0451 07/17/18 0545 07/18/18 0422 07/19/18 0905 07/20/18 0500 07/21/18 0555  WBC 18.5*  --  6.7  --  5.9 6.0 6.6 8.4  NEUTROABS 15.1*  --   --   --   --   --   --   --   HGB 12.9*   < > 10.5*  --  10.4* 10.4* 10.3* 11.3*  HCT 41.3   < > 32.9*  --  32.6* 32.6* 32.0* 34.8*  MCV 95.6  --  93.7  --  93.1 93.1 90.4 88.8  PLT 114*  --  53* 53* 69* 97* 119* 134*   < > = values in this interval not displayed.   Basic Metabolic Panel: Recent Labs  Lab 07/16/18 1535  07/17/18 0451 07/18/18 0422 07/19/18 0335 07/20/18 0500 07/21/18 0555  NA  --    < > 137 140 140 140 140  K  --    < > 3.4* 2.8* 3.2* 3.4* 3.3*  CL  --    < > 106 106 109 108 106  CO2  --    < > 25 26 25 23 23   GLUCOSE  --    < > 144* 147* 119* 92 95  BUN  --    < > 25* 15 13 10 12   CREATININE  --    < > 1.65* 1.02 0.75 0.70 0.74  CALCIUM  --    < > 7.4* 8.0* 7.8* 8.8* 9.1  MG 2.0   < > 2.1 1.9 1.6* 1.7 2.0  PHOS 5.0*  --  2.5  --  2.1* 4.6 4.6   < > = values in this interval not displayed.    Liver Function Tests: Recent Labs  Lab 07/17/18 0002 07/17/18 0451 07/18/18 1004 07/19/18 0335 07/20/18 0500 07/21/18 0555  AST 9,888* 9,812* 5,005* 1,805*  --  216*  ALT 4,628* 4,776* 5,262* 3,032*  --  1,667*  ALKPHOS 41 43 48 47  --  57  BILITOT 1.3* 1.2 1.9* 1.8*  --  2.0*  PROT 4.7* 4.8* 4.5* 4.6*  --  6.0*  ALBUMIN 2.5* 2.5* 2.3*  2.4* 2.8* 3.1*  3.1*   Recent Labs  Lab 07/16/18 1333  LIPASE 34   Recent Labs  Lab 07/16/18 1646  AMMONIA 47*   Coagulation Profile: Recent  Labs  Lab 07/16/18 1535 07/17/18 0545 07/18/18 0422 07/19/18 0335  INR 1.82 1.87 1.76 1.42   Cardiac Enzymes: Recent Labs  Lab 07/16/18 1535 07/17/18 0451 07/18/18 0422 07/18/18 1004 07/19/18 0335  CKTOTAL 1,173* 2,820* 2,289*  --   --   TROPONINI  --   --  8.50* 6.41* 2.32*   BNP (last 3 results) No results for input(s): PROBNP in the last 8760 hours. CBG: Recent Labs  Lab 07/19/18 2006 07/20/18 0022 07/20/18 0439 07/20/18 0844 07/20/18 1217  GLUCAP 78 79 83 83 102*   Studies: No results found.   Time spent: 35 minutes  Author: Lynden Oxford, MD [facility] Pager: [phone] 07/21/2018 5:15 PM  If 7PM-7AM, please contact night-coverage at [email or web address], password [facility]

## 2018-07-21 NOTE — Progress Notes (Signed)
Inpatient Rehabilitation  Please see consult by Dr. Riley Kill for full details.  Will plan to follow for post-op therapy needs, note potential surgery on Tuesday, 07/25/18.  Call if questions.   Charlane Ferretti., CCC/SLP Admission Coordinator  [facility] Inpatient Rehabilitation  Cell [phone]

## 2018-07-21 NOTE — Care Management Note (Addendum)
Case Management Note  Patient Details  Name: Randy Mcintyre MRN: [id number] Date of Birth: 26-Feb-1996  Subjective/Objective:  From home,  admitted 10/06 with unresponsiveness, acute renal failure, transaminitis. Pt was awaiting surgery for recent fall and L foot fracture.  Pt found next to empty bottle of painkillers,  required intubation 10/06, extubated 10/09, with pna. [facility] at 1 pm, per pt eval rec CIR, need CIR consult.   10/16 Letha Cape RN, BSN - Patient [facility] the [facility], he can utilized the [facility] pharmacy for medication ast Mon- Friday.  If dc over the weekend will need Match letter for medication ast.                  Action/Plan: NCM will follow for transition of care needs.   Expected Discharge Date:                  Expected Discharge Plan:     In-House Referral:  Clinical Social Work  Discharge planning Services  CM Consult  Post Acute Care Choice:    Choice offered to:     DME Arranged:    DME Agency:     HH Arranged:    HH Agency:     Status of Service:  In process, will continue to follow  If discussed at Long Length of Stay Meetings, dates discussed:    Additional Comments:  Leone Haven, RN 07/21/2018, 10:04 AM

## 2018-07-21 NOTE — Progress Notes (Signed)
Spoke with Engineer, water and educated her that her unit can removed central lines

## 2018-07-21 NOTE — Progress Notes (Signed)
Pharmacy Antibiotic Note  Randy Mcintyre is a 22 y.o. male admitted on 07/16/2018 with pneumonia. Pharmacy dosing Vancomycin since 10/6. Today pharmacy consulted to restart Aztreonam dosing for possible anerobe in blood culture. Dr. Allena Katz notes that this might be contaminant but still need coverage until proven.  Patient is afebrile and WBC is 8.5k.  Plan: Aztreonam 1g IV q8h Continue Vanc 1000 mg IV q6h , for 7 total days of tx for MRSA PNA (10/12 end of day). Monitor clinical status, renal function and culture results daily.    Height: 5' 9.5" (176.5 cm) Weight: 199 lb 15.3 oz (90.7 kg) IBW/kg (Calculated) : 71.85  Temp (24hrs), Avg:99.3 F (37.4 C), Min:98.6 F (37 C), Max:100.3 F (37.9 C)  Recent Labs  Lab 07/16/18 1338 07/16/18 1525 07/16/18 1535  07/17/18 0451 07/18/18 0422 07/18/18 1036 07/19/18 0335 07/19/18 0756 07/19/18 0905 07/20/18 0500 07/20/18 0920 07/21/18 0555  WBC  --   --   --   --  6.7 5.9  --   --   --  6.0 6.6  --  8.4  CREATININE 3.40*  --   --    < > 1.65* 1.02  --  0.75  --   --  0.70  --  0.74  LATICACIDVEN 3.84* 3.70* 4.2*  --   --   --  1.4  --   --   --   --   --   --   VANCOTROUGH  --   --   --   --   --   --   --   --  6*  --   --  14*  --    < > = values in this interval not displayed.    Estimated Creatinine Clearance: 161.3 mL/min (by C-G formula based on SCr of 0.74 mg/dL).    Allergies  Allergen Reactions  . Penicillins Other (See Comments)    Severe mouth ulcers Has patient had a PCN reaction causing immediate rash, facial/tongue/throat swelling, SOB or lightheadedness with hypotension: No HAS PT DEVELOPED SEVERE RASH INVOLVING MUCUS MEMBRANES or SKIN NECROSIS:  YES SEVERE ORAL ULCERS. Has patient had a PCN reaction that required hospitalization: No Has patient had a PCN reaction occurring within the last 10 years: Yes If all of the above answers are "NO", then may proceed with Cephalosporin use.    Antimicrobials this  admission: Vanc 10/6 >> (10/12) Azactam 10/6 >>10/9; restart Aztreonam 10/11>>  Dose adjustments this admission: VT 10/9: 6 > increased vancomycin from 1000 mg IV q8h > 1000 mg IV q6h VT 10/10 @0930 : 14 > continue at same dose  Microbiology results: 10/6 BCx -  1/2 GPR possible anerobe- pending    1/2 ngtd x4 days 10/6 MRSA PCR - positive 10/6 Trach Cx - MRSA (S to vancomycin, TCN, cipro) R to clinda, oxacillin, erythromycin.  Thank you for allowing pharmacy to be a part of this patient's care.  Noah Delaine, RPh Clinical Pharmacist Please check AMION for all [facility] Pharmacy phone numbers After 10:00 PM, call Main Pharmacy [phone] 07/21/2018 8:55 AM

## 2018-07-22 LAB — CULTURE, BLOOD (ROUTINE X 2): Special Requests: ADEQUATE

## 2018-07-22 LAB — COMPREHENSIVE METABOLIC PANEL
ALBUMIN: 3.3 g/dL — AB (ref 3.5–5.0)
ALT: 1197 U/L — ABNORMAL HIGH (ref 0–44)
ANION GAP: 10 (ref 5–15)
AST: 113 U/L — ABNORMAL HIGH (ref 15–41)
Alkaline Phosphatase: 63 U/L (ref 38–126)
BILIRUBIN TOTAL: 1.8 mg/dL — AB (ref 0.3–1.2)
BUN: 13 mg/dL (ref 6–20)
CO2: 21 mmol/L — ABNORMAL LOW (ref 22–32)
CREATININE: 0.84 mg/dL (ref 0.61–1.24)
Calcium: 9.3 mg/dL (ref 8.9–10.3)
Chloride: 108 mmol/L (ref 98–111)
GLUCOSE: 98 mg/dL (ref 70–99)
POTASSIUM: 3.9 mmol/L (ref 3.5–5.1)
Sodium: 139 mmol/L (ref 135–145)
TOTAL PROTEIN: 6.7 g/dL (ref 6.5–8.1)

## 2018-07-22 LAB — CK: CK TOTAL: 277 U/L (ref 49–397)

## 2018-07-22 LAB — PROTIME-INR
INR: 1.11
Prothrombin Time: 14.2 seconds (ref 11.4–15.2)

## 2018-07-22 LAB — CBC WITH DIFFERENTIAL/PLATELET
Abs Immature Granulocytes: 0.13 10*3/uL — ABNORMAL HIGH (ref 0.00–0.07)
BASOS ABS: 0 10*3/uL (ref 0.0–0.1)
Basophils Relative: 1 %
EOS ABS: 0.2 10*3/uL (ref 0.0–0.5)
EOS PCT: 2 %
HEMATOCRIT: 37.6 % — AB (ref 39.0–52.0)
Hemoglobin: 12 g/dL — ABNORMAL LOW (ref 13.0–17.0)
Immature Granulocytes: 2 %
LYMPHS ABS: 1.6 10*3/uL (ref 0.7–4.0)
Lymphocytes Relative: 19 %
MCH: 28.3 pg (ref 26.0–34.0)
MCHC: 31.9 g/dL (ref 30.0–36.0)
MCV: 88.7 fL (ref 80.0–100.0)
Monocytes Absolute: 0.7 10*3/uL (ref 0.1–1.0)
Monocytes Relative: 8 %
NRBC: 0 % (ref 0.0–0.2)
Neutro Abs: 5.7 10*3/uL (ref 1.7–7.7)
Neutrophils Relative %: 68 %
Platelets: 160 10*3/uL (ref 150–400)
RBC: 4.24 MIL/uL (ref 4.22–5.81)
RDW: 12.4 % (ref 11.5–15.5)
WBC: 8.2 10*3/uL (ref 4.0–10.5)

## 2018-07-22 LAB — LACTIC ACID, PLASMA: Lactic Acid, Venous: 0.7 mmol/L (ref 0.5–1.9)

## 2018-07-22 LAB — MAGNESIUM: MAGNESIUM: 2 mg/dL (ref 1.7–2.4)

## 2018-07-22 NOTE — Progress Notes (Signed)
[facility] Progress Note  Patient: Randy Mcintyre ZOX:[id number]   PCP: Patient, No Pcp Per DOB: 1996-04-06   DOA: 07/16/2018   DOS: 07/22/2018   Date of Service: the patient was seen and examined on 07/22/2018  Brief hospital course: Pt. with PMH of substance abuse, alcohol use; admitted on 07/16/2018, presented with complaint of unresponsive event, was found to have acute toxic and metabolic encephalopathy drug overdose, unintentional, acute kidney injury as well as liver dysfunction.  Patient was admitted to the ICU, intubated for airway protection.  Work-up found left lower lobe infiltrate with MRSA pneumonia.  Extubated on 07/19/2018 and transferred to stepdown on 07/21/2018 Currently further plan is continue current care.  Subjective: Feeling better.  No nausea no vomiting.  No fever no chills.  No abdominal pain.  Assessment and Plan: 1.  Acute hypoxic respiratory failure. MRSA pneumonia. Acute metabolic and toxic encephalopathy. Patient is currently on room air. Needed intubation to protect his airway. ET aspirate grew MRSA.  Blood culture 1 out of 2 also grew gram-positive rods which appears to be contaminant but possible anaerobes as well. For now we will continue IV vancomycin as well as I added IV Flagyl. Repeat cultures are so far no growth. Prior cultures have grown 1 out of 2 PROPRIONIBACTERIUM ACNES  likely contaminant.  WILL discuss with ID regarding the significance of this organism. Per CCM anticipating last day of IV antibiotics 07/22/2018, will continue with oral antibiotics for 3 more days following that.  2.  Acute metabolic and toxic encephalopathy. Presented with unresponsive event.  Needed intubation for airway protection. UDS is positive for positive for benzodiazepine, opiates, cocaine, marijuana. Patient was prescribed opiates and may have received benzodiazepine. Discussed with patient regarding cocaine and marijuana which he says he is not aware how it  showed up in his urine. Subsequent repeat drug toxicity scan is positive for marijuana but negative for cocaine metabolites on 07/17/2018. Likely initial event was a combination of patient's polysubstance abuse as well as use of narcotic pain medication and gabapentin. Currently patient is mentating better. Continue to monitor.  3.  Acute kidney injury. Baseline renal function normal. On presentation serum creatinine was 2.68.  CK was also elevated.  Repeat CK trending down. Current creatinine is normal. Patient is voiding okay as well. Monitor. Encourage oral hydration.  4.  Elevated LFT. AST peaked at 9888, ALT peaked at 4628. Subsequently trending down, AST currently in 100s, ALT currently 1000s. Bilirubin total is 2.0. INR last checked 2 days ago was 1.4. Ultrasound abdomen negative for any acute abnormality. Hepatitis work-up is also negative. Tylenol level was not significantly elevated. Suspected shock liver. Continue to avoid hepatotoxic medications.  5.  Recent left lower extremity trauma and fracture. Fall from stand, seen by Ortho outpatient and was scheduled to go for surgery on 07/17/2018. Currently surgery scheduled next Tuesday. We need to make sure that the patient is hemodynamically as well as from liver and kidney perspective stable to go through the surgery.  6.  Elevated troponin. ST segment changes. Troponin peaked at 8.50. Checked 2 days ago while he was 2.32. EKG shows T wave inversions in anterior leads as well as inferior leads. Echocardiogram shows 44 to 50% EF. Diffuse hypokinesis. Likely in response to shock, but will discuss with cardiology to ensure no further work up is needed.   7.  Hypokalemia Hypomagnesemia replaced.  8.  Thrombocytopenia. Acute, platelet dropped to 53. Currently 134. Likely in response to sepsis.  Diet: Regular diet  DVT Prophylaxis: subcutaneous Heparin  Advance goals of care discussion: Full code  Family  Communication: family was present at bedside, at the time of interview. The pt provided permission to discuss medical plan with the family. Opportunity was given to ask question and all questions were answered satisfactorily.   Disposition:  Discharge to CIR.  Consultants: primary admission with PCCM, CIR, orthopedic Procedures: Echocardiogram  EEG  Scheduled Meds: . chlorhexidine  15 mL Mouth Rinse BID  . docusate sodium  100 mg Oral BID  . enoxaparin (LOVENOX) injection  40 mg Subcutaneous Q24H  . folic acid  1 mg Oral Daily  . thiamine  100 mg Oral Daily   Continuous Infusions: . metronidazole 500 mg (07/22/18 0851)  . vancomycin 1,000 mg (07/22/18 1225)   PRN Meds: ondansetron (ZOFRAN) IV, oxyCODONE, traMADol Antibiotics: Anti-infectives (From admission, onward)   Start     Dose/Rate Route Frequency Ordered Stop   07/21/18 1630  metroNIDAZOLE (FLAGYL) IVPB 500 mg     500 mg 100 mL/hr over 60 Minutes Intravenous Every 8 hours 07/21/18 1528     07/21/18 1000  aztreonam (AZACTAM) 1 g in sodium chloride 0.9 % 100 mL IVPB  Status:  Discontinued     1 g 200 mL/hr over 30 Minutes Intravenous Every 8 hours 07/21/18 0908 07/21/18 1529   07/19/18 1600  vancomycin (VANCOCIN) IVPB 1000 mg/200 mL premix     1,000 mg 200 mL/hr over 60 Minutes Intravenous Every 6 hours 07/19/18 1359 07/23/18 0659   07/19/18 0900  aztreonam (AZACTAM) 1 g in sodium chloride 0.9 % 100 mL IVPB  Status:  Discontinued     1 g 200 mL/hr over 30 Minutes Intravenous Every 8 hours 07/19/18 0844 07/19/18 1348   07/18/18 0830  vancomycin (VANCOCIN) IVPB 1000 mg/200 mL premix  Status:  Discontinued     1,000 mg 200 mL/hr over 60 Minutes Intravenous Every 8 hours 07/18/18 0820 07/19/18 1359   07/17/18 1600  vancomycin (VANCOCIN) 1,250 mg in sodium chloride 0.9 % 250 mL IVPB  Status:  Discontinued     1,250 mg 166.7 mL/hr over 90 Minutes Intravenous Every 24 hours 07/16/18 1436 07/17/18 0815   07/17/18 0900   vancomycin (VANCOCIN) IVPB 1000 mg/200 mL premix  Status:  Discontinued     1,000 mg 200 mL/hr over 60 Minutes Intravenous Every 12 hours 07/17/18 0815 07/18/18 0820   07/16/18 2300  aztreonam (AZACTAM) 1 g in sodium chloride 0.9 % 100 mL IVPB  Status:  Discontinued     1 g 200 mL/hr over 30 Minutes Intravenous Every 8 hours 07/16/18 1436 07/18/18 1508   07/16/18 1445  vancomycin (VANCOCIN) 1,750 mg in sodium chloride 0.9 % 500 mL IVPB     1,750 mg 250 mL/hr over 120 Minutes Intravenous  Once 07/16/18 1436 07/16/18 1844   07/16/18 1430  vancomycin (VANCOCIN) IVPB 1000 mg/200 mL premix  Status:  Discontinued     1,000 mg 200 mL/hr over 60 Minutes Intravenous  Once 07/16/18 1429 07/16/18 1436   07/16/18 1430  aztreonam (AZACTAM) 2 g in sodium chloride 0.9 % 100 mL IVPB     2 g 200 mL/hr over 30 Minutes Intravenous  Once 07/16/18 1429 07/17/18 0028       Objective: Physical Exam: Vitals:   07/21/18 2019 07/22/18 0010 07/22/18 0400 07/22/18 0700  BP: 132/79 110/64 114/71 124/75  Pulse: (!) 101 100 (!) 102 (!) 103  Resp: 18   15  Temp: 98.6 F (37 C)  98.7 F (37.1 C)  98.2 F (36.8 C)  TempSrc: Oral Oral  Oral  SpO2: 97% 95% 95% 98%  Weight:      Height:        Intake/Output Summary (Last 24 hours) at 07/22/2018 1644 Last data filed at 07/22/2018 0240 Gross per 24 hour  Intake 714.09 ml  Output 300 ml  Net 414.09 ml   Filed Weights   07/17/18 0936  Weight: 90.7 kg   General: Alert, Awake and Oriented to Time, Place and Person. Appear in mild distress, affect flat Eyes: PERRL, Conjunctiva normal ENT: Oral Mucosa clear moist. Neck: no JVD, no Abnormal Mass Or lumps Cardiovascular: S1 and S2 Present, no Murmur, Peripheral Pulses Present Respiratory: normal respiratory effort, Bilateral Air entry equal and Decreased, no use of accessory muscle, Clear to Auscultation, no Crackles, no wheezes Abdomen: Bowel Sound present, Soft and no tenderness, no hernia Skin: no redness,  no Rash, no induration Extremities: no Pedal edema, no calf tenderness Neurologic: Grossly no focal neuro deficit. Bilaterally Equal motor strength  Data Reviewed: CBC: Recent Labs  Lab 07/16/18 1333  07/18/18 0422 07/19/18 0905 07/20/18 0500 07/21/18 0555 07/22/18 0408  WBC 18.5*   < > 5.9 6.0 6.6 8.4 8.2  NEUTROABS 15.1*  --   --   --   --   --  5.7  HGB 12.9*   < > 10.4* 10.4* 10.3* 11.3* 12.0*  HCT 41.3   < > 32.6* 32.6* 32.0* 34.8* 37.6*  MCV 95.6   < > 93.1 93.1 90.4 88.8 88.7  PLT 114*   < > 69* 97* 119* 134* 160   < > = values in this interval not displayed.   Basic Metabolic Panel: Recent Labs  Lab 07/16/18 1535  07/17/18 0451 07/18/18 0422 07/19/18 0335 07/20/18 0500 07/21/18 0555 07/22/18 0408  NA  --    < > 137 140 140 140 140 139  K  --    < > 3.4* 2.8* 3.2* 3.4* 3.3* 3.9  CL  --    < > 106 106 109 108 106 108  CO2  --    < > 25 26 25 23 23  21*  GLUCOSE  --    < > 144* 147* 119* 92 95 98  BUN  --    < > 25* 15 13 10 12 13   CREATININE  --    < > 1.65* 1.02 0.75 0.70 0.74 0.84  CALCIUM  --    < > 7.4* 8.0* 7.8* 8.8* 9.1 9.3  MG 2.0   < > 2.1 1.9 1.6* 1.7 2.0 2.0  PHOS 5.0*  --  2.5  --  2.1* 4.6 4.6  --    < > = values in this interval not displayed.    Liver Function Tests: Recent Labs  Lab 07/17/18 0451 07/18/18 1004 07/19/18 0335 07/20/18 0500 07/21/18 0555 07/22/18 0408  AST 9,812* 5,005* 1,805*  --  216* 113*  ALT 4,776* 5,262* 3,032*  --  1,667* 1,197*  ALKPHOS 43 48 47  --  57 63  BILITOT 1.2 1.9* 1.8*  --  2.0* 1.8*  PROT 4.8* 4.5* 4.6*  --  6.0* 6.7  ALBUMIN 2.5* 2.3* 2.4* 2.8* 3.1*  3.1* 3.3*   Recent Labs  Lab 07/16/18 1333  LIPASE 34   Recent Labs  Lab 07/16/18 1646  AMMONIA 47*   Coagulation Profile: Recent Labs  Lab 07/16/18 1535 07/17/18 0545 07/18/18 0422 07/19/18 0335 07/22/18 0408  INR 1.82  1.87 1.76 1.42 1.11   Cardiac Enzymes: Recent Labs  Lab 07/16/18 1535 07/17/18 0451 07/18/18 0422 07/18/18 1004  07/19/18 0335 07/22/18 0408  CKTOTAL 1,173* 2,820* 2,289*  --   --  277  TROPONINI  --   --  8.50* 6.41* 2.32*  --    BNP (last 3 results) No results for input(s): PROBNP in the last 8760 hours. CBG: Recent Labs  Lab 07/19/18 2006 07/20/18 0022 07/20/18 0439 07/20/18 0844 07/20/18 1217  GLUCAP 78 79 83 83 102*   Studies: No results found.   Time spent: 35 minutes  Author: Lynden Oxford, MD [facility] Pager: [phone] 07/22/2018 4:44 PM  If 7PM-7AM, please contact night-coverage at [email or web address], password [facility]

## 2018-07-23 LAB — CBC
HCT: 39.5 % (ref 39.0–52.0)
Hemoglobin: 12.6 g/dL — ABNORMAL LOW (ref 13.0–17.0)
MCH: 28.4 pg (ref 26.0–34.0)
MCHC: 31.9 g/dL (ref 30.0–36.0)
MCV: 89 fL (ref 80.0–100.0)
NRBC: 0 % (ref 0.0–0.2)
PLATELETS: 213 10*3/uL (ref 150–400)
RBC: 4.44 MIL/uL (ref 4.22–5.81)
RDW: 12.6 % (ref 11.5–15.5)
WBC: 8.6 10*3/uL (ref 4.0–10.5)

## 2018-07-23 LAB — COMPREHENSIVE METABOLIC PANEL
ALT: 855 U/L — ABNORMAL HIGH (ref 0–44)
ANION GAP: 12 (ref 5–15)
AST: 73 U/L — AB (ref 15–41)
Albumin: 3.5 g/dL (ref 3.5–5.0)
Alkaline Phosphatase: 64 U/L (ref 38–126)
BUN: 15 mg/dL (ref 6–20)
CO2: 20 mmol/L — ABNORMAL LOW (ref 22–32)
Calcium: 9.4 mg/dL (ref 8.9–10.3)
Chloride: 109 mmol/L (ref 98–111)
Creatinine, Ser: 0.8 mg/dL (ref 0.61–1.24)
Glucose, Bld: 76 mg/dL (ref 70–99)
POTASSIUM: 3.8 mmol/L (ref 3.5–5.1)
Sodium: 141 mmol/L (ref 135–145)
TOTAL PROTEIN: 6.7 g/dL (ref 6.5–8.1)
Total Bilirubin: 1.5 mg/dL — ABNORMAL HIGH (ref 0.3–1.2)

## 2018-07-23 MED ORDER — DOXYCYCLINE HYCLATE 100 MG PO TABS
100.0000 mg | ORAL_TABLET | Freq: Two times a day (BID) | ORAL | Status: AC
  Administered 2018-07-23 – 2018-07-25 (×6): 100 mg via ORAL
  Filled 2018-07-23 (×6): qty 1

## 2018-07-23 NOTE — Progress Notes (Signed)
[facility] Progress Note  Patient: Randy Mcintyre ZOX:[id number]   PCP: Patient, No Pcp Per DOB: Apr 15, 1996   DOA: 07/16/2018   DOS: 07/23/2018   Date of Service: the patient was seen and examined on 07/23/2018  Brief hospital course: Pt. with PMH of substance abuse, alcohol use; admitted on 07/16/2018, presented with complaint of unresponsive event, was found to have acute toxic and metabolic encephalopathy drug overdose, unintentional, acute kidney injury as well as liver dysfunction.  Patient was admitted to the ICU, intubated for airway protection.  Work-up found left lower lobe infiltrate with MRSA pneumonia.  Extubated on 07/19/2018 and transferred to stepdown on 07/21/2018 Currently further plan is continue current care.  Subjective: No acute complaint.  No diarrhea.  Had a temp last night.  No chest pain no abdominal pain.  No cough.  No chills.  No confusion.  Assessment and Plan: 1.  Acute hypoxic respiratory failure. MRSA pneumonia. Acute metabolic and toxic encephalopathy. Patient is currently on room air. Needed intubation to protect his airway. ET aspirate grew MRSA.  Blood culture 1 out of 2 also grew gram-positive rods which appears to be contaminant but possible anaerobes as well. For now we will continue IV vancomycin as well as I added IV Flagyl. Repeat cultures are so far no growth. Prior cultures have grown 1 out of 2 PROPRIONIBACTERIUM ACNES  likely contaminant.  WILL discuss with ID regarding the significance of this organism. 3 more days of oral doxycycline to complete a total 10-day treatment course.  2.  Acute metabolic and toxic encephalopathy. Presented with unresponsive event.  Needed intubation for airway protection. UDS is positive for positive for benzodiazepine, opiates, cocaine, marijuana. Patient was prescribed opiates and may have received benzodiazepine. Discussed with patient regarding cocaine and marijuana which he says he is not aware how it showed  up in his urine. Subsequent repeat drug toxicity scan is positive for marijuana but negative for cocaine metabolites on 07/17/2018. Likely initial event was a combination of patient's polysubstance abuse as well as use of narcotic pain medication and gabapentin. Currently patient is mentating better. Continue to monitor.  3.  Acute kidney injury. Baseline renal function normal. On presentation serum creatinine was 2.68.  CK was also elevated.  Repeat CK trending down. Current creatinine is normal. Patient is voiding okay as well. Monitor. Encourage oral hydration.  4.  Elevated LFT. AST peaked at 9888, ALT peaked at 4628. Subsequently trending down, AST currently in 100s, ALT currently 800s. Bilirubin total is 2.0. INR last checked was normal Ultrasound abdomen negative for any acute abnormality. Hepatitis work-up is also negative. Tylenol level was not significantly elevated. Suspected shock liver. Continue to avoid hepatotoxic medications.  5.  Recent left lower extremity trauma and fracture. Fall from stand, seen by Ortho outpatient and was scheduled to go for surgery on 07/17/2018. Currently surgery scheduled next Tuesday. We need to make sure that the patient is hemodynamically as well as from liver and kidney perspective stable to go through the surgery.  6.  Elevated troponin. ST segment changes. Troponin peaked at 8.50. Checked 2 days ago while he was 2.32. EKG shows T wave inversions in anterior leads as well as inferior leads. Echocardiogram shows 44 to 50% EF. Diffuse hypokinesis. Likely in response to shock, but will discuss with cardiology to ensure no further work up is needed.   7.  Hypokalemia Hypomagnesemia replaced.  8.  Thrombocytopenia. Acute, platelet dropped to 53. Currently normal. Likely in response to sepsis.  Diet:  Regular diet DVT Prophylaxis: subcutaneous Heparin  Advance goals of care discussion: Full code  Family Communication: family  was present at bedside, at the time of interview. The pt provided permission to discuss medical plan with the family. Opportunity was given to ask question and all questions were answered satisfactorily.   Disposition:  Discharge to CIR.  Consultants: primary admission with PCCM, CIR, orthopedic Procedures: Echocardiogram  EEG  Scheduled Meds: . chlorhexidine  15 mL Mouth Rinse BID  . docusate sodium  100 mg Oral BID  . doxycycline  100 mg Oral Q12H  . enoxaparin (LOVENOX) injection  40 mg Subcutaneous Q24H  . folic acid  1 mg Oral Daily  . thiamine  100 mg Oral Daily   Continuous Infusions:  PRN Meds: ondansetron (ZOFRAN) IV, oxyCODONE, traMADol Antibiotics: Anti-infectives (From admission, onward)   Start     Dose/Rate Route Frequency Ordered Stop   07/23/18 1000  doxycycline (VIBRA-TABS) tablet 100 mg     100 mg Oral Every 12 hours 07/23/18 0749 07/26/18 0959   07/21/18 1630  metroNIDAZOLE (FLAGYL) IVPB 500 mg  Status:  Discontinued     500 mg 100 mL/hr over 60 Minutes Intravenous Every 8 hours 07/21/18 1528 07/22/18 1701   07/21/18 1000  aztreonam (AZACTAM) 1 g in sodium chloride 0.9 % 100 mL IVPB  Status:  Discontinued     1 g 200 mL/hr over 30 Minutes Intravenous Every 8 hours 07/21/18 0908 07/21/18 1529   07/19/18 1600  vancomycin (VANCOCIN) IVPB 1000 mg/200 mL premix     1,000 mg 200 mL/hr over 60 Minutes Intravenous Every 6 hours 07/19/18 1359 07/23/18 0148   07/19/18 0900  aztreonam (AZACTAM) 1 g in sodium chloride 0.9 % 100 mL IVPB  Status:  Discontinued     1 g 200 mL/hr over 30 Minutes Intravenous Every 8 hours 07/19/18 0844 07/19/18 1348   07/18/18 0830  vancomycin (VANCOCIN) IVPB 1000 mg/200 mL premix  Status:  Discontinued     1,000 mg 200 mL/hr over 60 Minutes Intravenous Every 8 hours 07/18/18 0820 07/19/18 1359   07/17/18 1600  vancomycin (VANCOCIN) 1,250 mg in sodium chloride 0.9 % 250 mL IVPB  Status:  Discontinued     1,250 mg 166.7 mL/hr over 90  Minutes Intravenous Every 24 hours 07/16/18 1436 07/17/18 0815   07/17/18 0900  vancomycin (VANCOCIN) IVPB 1000 mg/200 mL premix  Status:  Discontinued     1,000 mg 200 mL/hr over 60 Minutes Intravenous Every 12 hours 07/17/18 0815 07/18/18 0820   07/16/18 2300  aztreonam (AZACTAM) 1 g in sodium chloride 0.9 % 100 mL IVPB  Status:  Discontinued     1 g 200 mL/hr over 30 Minutes Intravenous Every 8 hours 07/16/18 1436 07/18/18 1508   07/16/18 1445  vancomycin (VANCOCIN) 1,750 mg in sodium chloride 0.9 % 500 mL IVPB     1,750 mg 250 mL/hr over 120 Minutes Intravenous  Once 07/16/18 1436 07/16/18 1844   07/16/18 1430  vancomycin (VANCOCIN) IVPB 1000 mg/200 mL premix  Status:  Discontinued     1,000 mg 200 mL/hr over 60 Minutes Intravenous  Once 07/16/18 1429 07/16/18 1436   07/16/18 1430  aztreonam (AZACTAM) 2 g in sodium chloride 0.9 % 100 mL IVPB     2 g 200 mL/hr over 30 Minutes Intravenous  Once 07/16/18 1429 07/17/18 0028       Objective: Physical Exam: Vitals:   07/23/18 0037 07/23/18 0148 07/23/18 0825 07/23/18 1551  BP: 108/65  118/70 110/64  Pulse: (!) 102  88 97  Resp: 16  16 20   Temp: (!) 100.8 F (38.2 C) 98.3 F (36.8 C) 98 F (36.7 C) 98.5 F (36.9 C)  TempSrc: Oral Oral  Oral  SpO2: 97%  95% 98%  Weight:      Height:        Intake/Output Summary (Last 24 hours) at 07/23/2018 1753 Last data filed at 07/23/2018 0148 Gross per 24 hour  Intake 1075.41 ml  Output -  Net 1075.41 ml   Filed Weights   07/17/18 0936  Weight: 90.7 kg   General: Alert, Awake and Oriented to Time, Place and Person. Appear in mild distress, affect flat Eyes: PERRL, Conjunctiva normal ENT: Oral Mucosa clear moist. Neck: no JVD, no Abnormal Mass Or lumps Cardiovascular: S1 and S2 Present, no Murmur, Peripheral Pulses Present Respiratory: normal respiratory effort, Bilateral Air entry equal and Decreased, no use of accessory muscle, Clear to Auscultation, no Crackles, no  wheezes Abdomen: Bowel Sound present, Soft and no tenderness, no hernia Skin: no redness, no Rash, no induration Extremities: no Pedal edema, no calf tenderness Neurologic: Grossly no focal neuro deficit. Bilaterally Equal motor strength  Data Reviewed: CBC: Recent Labs  Lab 07/19/18 0905 07/20/18 0500 07/21/18 0555 07/22/18 0408 07/23/18 0301  WBC 6.0 6.6 8.4 8.2 8.6  NEUTROABS  --   --   --  5.7  --   HGB 10.4* 10.3* 11.3* 12.0* 12.6*  HCT 32.6* 32.0* 34.8* 37.6* 39.5  MCV 93.1 90.4 88.8 88.7 89.0  PLT 97* 119* 134* 160 213   Basic Metabolic Panel: Recent Labs  Lab 07/17/18 0451 07/18/18 0422 07/19/18 0335 07/20/18 0500 07/21/18 0555 07/22/18 0408 07/23/18 0301  NA 137 140 140 140 140 139 141  K 3.4* 2.8* 3.2* 3.4* 3.3* 3.9 3.8  CL 106 106 109 108 106 108 109  CO2 25 26 25 23 23  21* 20*  GLUCOSE 144* 147* 119* 92 95 98 76  BUN 25* 15 13 10 12 13 15   CREATININE 1.65* 1.02 0.75 0.70 0.74 0.84 0.80  CALCIUM 7.4* 8.0* 7.8* 8.8* 9.1 9.3 9.4  MG 2.1 1.9 1.6* 1.7 2.0 2.0  --   PHOS 2.5  --  2.1* 4.6 4.6  --   --     Liver Function Tests: Recent Labs  Lab 07/18/18 1004 07/19/18 0335 07/20/18 0500 07/21/18 0555 07/22/18 0408 07/23/18 0301  AST 5,005* 1,805*  --  216* 113* 73*  ALT 5,262* 3,032*  --  1,667* 1,197* 855*  ALKPHOS 48 47  --  57 63 64  BILITOT 1.9* 1.8*  --  2.0* 1.8* 1.5*  PROT 4.5* 4.6*  --  6.0* 6.7 6.7  ALBUMIN 2.3* 2.4* 2.8* 3.1*  3.1* 3.3* 3.5   No results for input(s): LIPASE, AMYLASE in the last 168 hours. No results for input(s): AMMONIA in the last 168 hours. Coagulation Profile: Recent Labs  Lab 07/17/18 0545 07/18/18 0422 07/19/18 0335 07/22/18 0408  INR 1.87 1.76 1.42 1.11   Cardiac Enzymes: Recent Labs  Lab 07/17/18 0451 07/18/18 0422 07/18/18 1004 07/19/18 0335 07/22/18 0408  CKTOTAL 2,820* 2,289*  --   --  277  TROPONINI  --  8.50* 6.41* 2.32*  --    BNP (last 3 results) No results for input(s): PROBNP in the  last 8760 hours. CBG: Recent Labs  Lab 07/19/18 2006 07/20/18 0022 07/20/18 0439 07/20/18 0844 07/20/18 1217  GLUCAP 78 79 83 83 102*   Studies:  No results found.   Time spent: 35 minutes  Author: Lynden Oxford, MD [facility] Pager: [phone] 07/23/2018 5:53 PM  If 7PM-7AM, please contact night-coverage at [email or web address], password [facility]

## 2018-07-24 LAB — CBC
HEMATOCRIT: 40.5 % (ref 39.0–52.0)
Hemoglobin: 13.2 g/dL (ref 13.0–17.0)
MCH: 28.8 pg (ref 26.0–34.0)
MCHC: 32.6 g/dL (ref 30.0–36.0)
MCV: 88.2 fL (ref 80.0–100.0)
Platelets: 261 10*3/uL (ref 150–400)
RBC: 4.59 MIL/uL (ref 4.22–5.81)
RDW: 12.5 % (ref 11.5–15.5)
WBC: 9.4 10*3/uL (ref 4.0–10.5)
nRBC: 0 % (ref 0.0–0.2)

## 2018-07-24 LAB — COMPREHENSIVE METABOLIC PANEL
ALT: 642 U/L — ABNORMAL HIGH (ref 0–44)
ANION GAP: 8 (ref 5–15)
AST: 57 U/L — ABNORMAL HIGH (ref 15–41)
Albumin: 3.8 g/dL (ref 3.5–5.0)
Alkaline Phosphatase: 64 U/L (ref 38–126)
BILIRUBIN TOTAL: 1.6 mg/dL — AB (ref 0.3–1.2)
BUN: 16 mg/dL (ref 6–20)
CO2: 22 mmol/L (ref 22–32)
Calcium: 9.6 mg/dL (ref 8.9–10.3)
Chloride: 108 mmol/L (ref 98–111)
Creatinine, Ser: 0.88 mg/dL (ref 0.61–1.24)
GFR calc Af Amer: >60 mL/min (ref >60)
GFR calc non Af Amer: >60 mL/min (ref >60)
Glucose, Bld: 97 mg/dL (ref 70–99)
POTASSIUM: 3.9 mmol/L (ref 3.5–5.1)
Sodium: 138 mmol/L (ref 135–145)
TOTAL PROTEIN: 7.4 g/dL (ref 6.5–8.1)

## 2018-07-24 NOTE — Progress Notes (Signed)
Inpatient Rehabilitation  Continuing to follow for post acute rehab needs follow surgery tomorrow.  Call if questions.   Charlane Ferretti., CCC/SLP Admission Coordinator  [facility]patient [facility]  Cell [phone]

## 2018-07-24 NOTE — Progress Notes (Signed)
Physical Therapy Treatment Patient Details Name: Randy Mcintyre MRN: [id number] DOB: 10/22/1995 Today's Date: 07/24/2018    History of Present Illness 22 y.o. male admitted 10/06 with unresponsiveness, acute renal failure, transaminitis. Pt was awaiting surgery for recent fall and L foot fracture.  Pt found next to empty bottle of painkillers, upon admission required intubation 10/06, extubated 10/09 without complications.     PT Comments    Pt progressing towards physical therapy goals. Was able to perform transfers and ambulation with improved independence this session however continues to require hands-on assist for balance support and safety. Problem solving remains slow and safety awareness is decreased. Although pt has shown improvement this session feel he would potentially need a significant amount of assistance for stair negotiation (has 3 STE home). Will continue to follow and assess for post-acute needs s/p planned foot surgery tomorrow.    Follow Up Recommendations  CIR     Equipment Recommendations  (TBD)    Recommendations for Other Services Speech consult(SLP for cognition)     Precautions / Restrictions Precautions Precautions: Fall Restrictions Weight Bearing Restrictions: Yes LLE Weight Bearing: Non weight bearing    Mobility  Bed Mobility Overal bed mobility: Needs Assistance Bed Mobility: Supine to Sit     Supine to sit: Supervision        Transfers Overall transfer level: Needs assistance Equipment used: Rolling walker (2 wheeled) Transfers: Sit to/from Stand Sit to Stand: Min guard         General transfer comment: Hands-on assist provided for safety. As stated above, pt attempting to stand on one foot and hop over to get walker rather than waiting for therapist to bring it to him. Denies dizziness however mother present and mouthing to therapist that hes been getting dizzy and not telling anyone about it.   Ambulation/Gait Ambulation/Gait  assistance: Min guard Gait Distance (Feet): 100 Feet Assistive device: Rolling walker (2 wheeled) Gait Pattern/deviations: Step-to pattern;Decreased stride length;Trunk flexed Gait velocity: Decreased Gait velocity interpretation: <1.31 ft/sec, indicative of household ambulator General Gait Details: Raised walker height up to improve posture. Pt moving slow with jerky hops. Hands-on guarding provided but no significant assistance required.    Stairs             Wheelchair Mobility    Modified Rankin (Stroke Patients Only)       Balance Overall balance assessment: Needs assistance Sitting-balance support: Bilateral upper extremity supported;Feet supported Sitting balance-Leahy Scale: Fair     Standing balance support: No upper extremity supported Standing balance-Leahy Scale: Poor                              Cognition Arousal/Alertness: Awake/alert Behavior During Therapy: WFL for tasks assessed/performed(A little flat but may be baseline) Overall Cognitive Status: Impaired/Different from baseline Area of Impairment: Safety/judgement;Attention;Awareness;Problem solving                   Current Attention Level: Selective     Safety/Judgement: Decreased awareness of safety;Decreased awareness of deficits Awareness: Emergent Problem Solving: Slow processing;Requires verbal cues General Comments: Pt attempting to stand EOB before walker was in front of him. Maintained NWB well but pt felt he could hop on one foot over to the walker instead of waiting for therapist to bring it over to him.      Exercises Other Exercises Other Exercises: Reviewed HEP verbally.     General Comments  Pertinent Vitals/Pain Pain Assessment: No/denies pain Pain Intervention(s): Monitored during session    Home Living                      Prior Function            PT Goals (current goals can now be found in the care plan section) Acute Rehab  PT Goals Patient Stated Goal: Go to rehab PT Goal Formulation: With patient Time For Goal Achievement: 08/03/18 Potential to Achieve Goals: Good Progress towards PT goals: Progressing toward goals    Frequency    Min 3X/week      PT Plan Current plan remains appropriate    Co-evaluation              AM-PAC PT "6 Clicks" Daily Activity  Outcome Measure  Difficulty turning over in bed (including adjusting bedclothes, sheets and blankets)?: None Difficulty moving from lying on back to sitting on the side of the bed? : A Little Difficulty sitting down on and standing up from a chair with arms (e.g., wheelchair, bedside commode, etc,.)?: A Little Help needed moving to and from a bed to chair (including a wheelchair)?: A Little Help needed walking in hospital room?: A Little Help needed climbing 3-5 steps with a railing? : A Lot 6 Click Score: 18    End of Session Equipment Utilized During Treatment: Gait belt Activity Tolerance: Patient tolerated treatment well Patient left: in bed;with call bell/phone within reach;with family/visitor present Nurse Communication: Mobility status PT Visit Diagnosis: Unsteadiness on feet (R26.81);Other abnormalities of gait and mobility (R26.89);Muscle weakness (generalized) (M62.81);Difficulty in walking, not elsewhere classified (R26.2);Pain Pain - Right/Left: Left Pain - part of body: Ankle and joints of foot     Time: 2956-2130 PT Time Calculation (min) (ACUTE ONLY): 10 min  Charges:  $Gait Training: 8-22 mins                     Conni Slipper, PT, DPT Acute Rehabilitation Services Pager: [phone] Office: [phone]    Marylynn Pearson 07/24/2018, 11:16 AM

## 2018-07-24 NOTE — Progress Notes (Signed)
Occupational Therapy Treatment Patient Details Name: Randy Mcintyre MRN: [id number] DOB: 10/22/1995 Today's Date: 07/24/2018    History of present illness 22 y.o. male admitted 10/06 with unresponsiveness, acute renal failure, transaminitis. Pt was awaiting surgery for recent fall (from deerstand) and L foot fracture.  Pt found next to empty bottle of painkillers, upon admission required intubation 10/06, extubated 10/09 without complications.    OT comments  This 22 yo male admitted with above and to have surgery on LLE (now scheduled for Wednesday of this week) presents to acute OT making progress towards basic ADL goals of bathing and dressing. He will continue to benefit from acute OT with follow up on CIR. If continues to progress as well as he is post surgery then may not need CIR.   Follow Up Recommendations  CIR    Equipment Recommendations  3 in 1 bedside commode;Tub/shower bench       Precautions / Restrictions Precautions Precautions: Fall Restrictions Weight Bearing Restrictions: Yes LLE Weight Bearing: Non weight bearing       Mobility Bed Mobility Overal bed mobility: Needs Assistance Bed Mobility: Sit to Supine       Sit to supine: Supervision      Transfers Overall transfer level: Needs assistance Equipment used: Rolling walker (2 wheeled) Transfers: Sit to/from Stand Sit to Stand: Min guard         General transfer comment: VCs for safety with hand placement for sit<>stand    Balance Overall balance assessment: Needs assistance Sitting-balance support: No upper extremity supported Sitting balance-Leahy Scale: Good     Standing balance support: Single extremity supported;During functional activity Standing balance-Leahy Scale: Poor Standing balance comment: relaint on one hand on RW for pulling up boxers                           ADL either performed or assessed with clinical judgement   ADL Overall ADL's : Needs  assistance/impaired         Upper Body Bathing: Supervision/ safety;Set up;Sitting Upper Body Bathing Details (indicate cue type and reason): shower Lower Body Bathing: Min guard;Sit to/from stand Lower Body Bathing Details (indicate cue type and reason): shower (RN had LLE and RUE with IV already wrapped for shower)     Lower Body Dressing: Min guard;Sit to/from stand Lower Body Dressing Details (indicate cue type and reason): for boxers Toilet Transfer: Min guard;Ambulation;RW   Toileting- Architect and Hygiene: Min guard;Sit to/from stand               Vision Patient Visual Report: No change from baseline            Cognition Arousal/Alertness: Awake/alert Behavior During Therapy: Flat affect Overall Cognitive Status: Impaired/Different from baseline Area of Impairment: Problem solving                             Problem Solving: Requires verbal cues(for shower stall transfer while hopping over threshold) General Comments: This PM session pt not standing without waiting on RW or he was holding onto grab bar in shower, maintaind NWB'ing without cues                   Pertinent Vitals/ Pain       Pain Assessment: No/denies pain     Prior Functioning/Environment              Frequency  Min 3X/week  Progress Toward Goals  OT Goals(current goals can now be found in the care plan section)  Progress towards OT goals: Progressing toward goals     Plan Discharge plan remains appropriate(but may progress to not needing CIR post surgery)       AM-PAC PT "6 Clicks" Daily Activity     Outcome Measure   Help from another person eating meals?: None Help from another person taking care of personal grooming?: A Little Help from another person toileting, which includes using toliet, bedpan, or urinal?: A Little Help from another person bathing (including washing, rinsing, drying)?: A Little Help from another person to put on  and taking off regular upper body clothing?: A Little Help from another person to put on and taking off regular lower body clothing?: A Little 6 Click Score: 19    End of Session Equipment Utilized During Treatment: Rolling walker  OT Visit Diagnosis: Unsteadiness on feet (R26.81);Other abnormalities of gait and mobility (R26.89)   Activity Tolerance Patient tolerated treatment well   Patient Left in bed;with call bell/phone within reach;with family/visitor present   Nurse Communication (pt out of shower, coverings removed from LLE and RUE--bandage on leg is wet)        Time: 1610-9604 OT Time Calculation (min): 25 min  Charges: OT General Charges $OT Visit: 1 Visit OT Treatments $Self Care/Home Management : 23-37 mins  Ignacia Palma, OTR/L Acute [employer] Pager [phone] Office [phone]

## 2018-07-24 NOTE — Progress Notes (Signed)
Due to OR availability I have to postpone his surgery until Wednesday, 10/16 at 1:00pm.

## 2018-07-24 NOTE — Progress Notes (Signed)
[facility] Progress Note  Patient: Randy Mcintyre ZOX:[id number]   PCP: Patient, No Pcp Per DOB: [birth year]   DOA: 07/16/2018   DOS: 07/24/2018   Date of Service: the patient was seen and examined on 07/24/2018  Brief hospital course: Pt. with PMH of substance abuse, alcohol use; admitted on 07/16/2018, presented with complaint of unresponsive event, was found to have acute toxic and metabolic encephalopathy drug overdose, unintentional, acute kidney injury as well as liver dysfunction.  Patient was admitted to the ICU, intubated for airway protection.  Work-up found left lower lobe infiltrate with MRSA pneumonia.  Extubated on 07/19/2018 and transferred to stepdown on 07/21/2018 Currently further plan is await for the surgery scheduled on Wednesday and follow-up on safe discharge plan.  Subjective: No acute complaint.  No acute events overnight.  Assessment and Plan: 1.  Acute hypoxic respiratory failure. MRSA pneumonia. Acute metabolic and toxic encephalopathy. Patient is currently on room air. Needed intubation to protect his airway. ET aspirate grew MRSA.  Blood culture 1 out of 2 also grew gram-positive rods which appears to be contaminant but possible anaerobes as well. For now we will continue IV vancomycin as well as I added IV Flagyl. Repeat cultures are so far no growth. Prior cultures have grown 1 out of 2 PROPRIONIBACTERIUM ACNES  likely contaminant.  WILL discuss with ID regarding the significance of this organism. oral doxycycline to complete a total 10-day treatment course.  2.  Acute metabolic and toxic encephalopathy. Presented with unresponsive event.  Needed intubation for airway protection. UDS is positive for positive for benzodiazepine, opiates, cocaine, marijuana. Patient was prescribed opiates and may have received benzodiazepine. Discussed with patient regarding cocaine and marijuana which he says he is not aware how it showed up in his urine. Subsequent repeat  drug toxicity scan is positive for marijuana but negative for cocaine metabolites on 07/17/2018. Likely initial event was a combination of patient's polysubstance abuse as well as use of narcotic pain medication and gabapentin. Currently patient is mentating better. Continue to monitor.  3.  Acute kidney injury. Baseline renal function normal. On presentation serum creatinine was 2.68.  CK was also elevated.  Repeat CK trending down. Current creatinine is normal. Patient is voiding okay as well. Monitor. Encourage oral hydration.  4.  Elevated LFT. AST peaked at 9888, ALT peaked at 4628. Subsequently trending down, AST currently in 51, ALT currently 100s. Bilirubin total is 2.0. INR last checked was normal Ultrasound abdomen negative for any acute abnormality. Hepatitis work-up is also negative. Tylenol level was not significantly elevated. Suspected shock liver. Continue to avoid hepatotoxic medications.  5.  Recent left lower extremity trauma and fracture. Fall from stand, seen by Ortho outpatient and was scheduled to go for surgery on 07/17/2018. Currently surgery scheduled next Wednesday.  OR did not have enough time for requested surgery per orthopedic on Tuesday. We need to make sure that the patient is hemodynamically as well as from liver and kidney perspective stable to go through the surgery. Postoperative pain control would also be difficult.  For now recommending short course of treatment ranging from 2 to 3 days on discharge.  6.  Elevated troponin. ST segment changes. Troponin peaked at 8.50. Checked 2 days ago while he was 2.32. EKG shows T wave inversions in anterior leads as well as inferior leads. Echocardiogram shows 44 to 50% EF. Diffuse hypokinesis. Likely in response to shock, but will discuss with cardiology to ensure no further work up is needed.  7.  Hypokalemia Hypomagnesemia replaced.  8.  Thrombocytopenia. Acute, platelet dropped to  53. Currently normal. Likely in response to sepsis.  Diet: Regular diet DVT Prophylaxis: subcutaneous Heparin  Advance goals of care discussion: Full code  Family Communication: family was present at bedside, at the time of interview. The pt provided permission to discuss medical plan with the family. Opportunity was given to ask question and all questions were answered satisfactorily.   Disposition:  Discharge to CIR.  Consultants: primary admission with PCCM, CIR, orthopedic Procedures: Echocardiogram  EEG  Scheduled Meds: . chlorhexidine  15 mL Mouth Rinse BID  . docusate sodium  100 mg Oral BID  . doxycycline  100 mg Oral Q12H  . enoxaparin (LOVENOX) injection  40 mg Subcutaneous Q24H  . folic acid  1 mg Oral Daily  . thiamine  100 mg Oral Daily   Continuous Infusions:  PRN Meds: ondansetron (ZOFRAN) IV, oxyCODONE, traMADol Antibiotics: Anti-infectives (From admission, onward)   Start     Dose/Rate Route Frequency Ordered Stop   07/23/18 1000  doxycycline (VIBRA-TABS) tablet 100 mg     100 mg Oral Every 12 hours 07/23/18 0749 07/26/18 0959   07/21/18 1630  metroNIDAZOLE (FLAGYL) IVPB 500 mg  Status:  Discontinued     500 mg 100 mL/hr over 60 Minutes Intravenous Every 8 hours 07/21/18 1528 07/22/18 1701   07/21/18 1000  aztreonam (AZACTAM) 1 g in sodium chloride 0.9 % 100 mL IVPB  Status:  Discontinued     1 g 200 mL/hr over 30 Minutes Intravenous Every 8 hours 07/21/18 0908 07/21/18 1529   07/19/18 1600  vancomycin (VANCOCIN) IVPB 1000 mg/200 mL premix     1,000 mg 200 mL/hr over 60 Minutes Intravenous Every 6 hours 07/19/18 1359 07/23/18 0148   07/19/18 0900  aztreonam (AZACTAM) 1 g in sodium chloride 0.9 % 100 mL IVPB  Status:  Discontinued     1 g 200 mL/hr over 30 Minutes Intravenous Every 8 hours 07/19/18 0844 07/19/18 1348   07/18/18 0830  vancomycin (VANCOCIN) IVPB 1000 mg/200 mL premix  Status:  Discontinued     1,000 mg 200 mL/hr over 60 Minutes  Intravenous Every 8 hours 07/18/18 0820 07/19/18 1359   07/17/18 1600  vancomycin (VANCOCIN) 1,250 mg in sodium chloride 0.9 % 250 mL IVPB  Status:  Discontinued     1,250 mg 166.7 mL/hr over 90 Minutes Intravenous Every 24 hours 07/16/18 1436 07/17/18 0815   07/17/18 0900  vancomycin (VANCOCIN) IVPB 1000 mg/200 mL premix  Status:  Discontinued     1,000 mg 200 mL/hr over 60 Minutes Intravenous Every 12 hours 07/17/18 0815 07/18/18 0820   07/16/18 2300  aztreonam (AZACTAM) 1 g in sodium chloride 0.9 % 100 mL IVPB  Status:  Discontinued     1 g 200 mL/hr over 30 Minutes Intravenous Every 8 hours 07/16/18 1436 07/18/18 1508   07/16/18 1445  vancomycin (VANCOCIN) 1,750 mg in sodium chloride 0.9 % 500 mL IVPB     1,750 mg 250 mL/hr over 120 Minutes Intravenous  Once 07/16/18 1436 07/16/18 1844   07/16/18 1430  vancomycin (VANCOCIN) IVPB 1000 mg/200 mL premix  Status:  Discontinued     1,000 mg 200 mL/hr over 60 Minutes Intravenous  Once 07/16/18 1429 07/16/18 1436   07/16/18 1430  aztreonam (AZACTAM) 2 g in sodium chloride 0.9 % 100 mL IVPB     2 g 200 mL/hr over 30 Minutes Intravenous  Once 07/16/18 1429 07/17/18 0028  Objective: Physical Exam: Vitals:   07/23/18 1551 07/23/18 2353 07/24/18 0752 07/24/18 1709  BP: 110/64 131/74 122/84 119/76  Pulse: 97 93 93 (!) 101  Resp: 20 (!) 22  18  Temp: 98.5 F (36.9 C) 98.3 F (36.8 C) 98 F (36.7 C)   TempSrc: Oral Oral Oral   SpO2: 98% 98% 100% 99%  Weight:      Height:        Intake/Output Summary (Last 24 hours) at 07/24/2018 1758 Last data filed at 07/24/2018 0900 Gross per 24 hour  Intake 240 ml  Output 200 ml  Net 40 ml   Filed Weights   07/17/18 0936  Weight: 90.7 kg   General: Alert, Awake and Oriented to Time, Place and Person. Appear in mild distress, affect flat Eyes: PERRL, Conjunctiva normal ENT: Oral Mucosa clear moist. Neck: no JVD, no Abnormal Mass Or lumps Cardiovascular: S1 and S2 Present, no Murmur,  Peripheral Pulses Present Respiratory: normal respiratory effort, Bilateral Air entry equal and Decreased, no use of accessory muscle, Clear to Auscultation, no Crackles, no wheezes Abdomen: Bowel Sound present, Soft and no tenderness, no hernia Skin: no redness, no Rash, no induration Extremities: no Pedal edema, no calf tenderness Neurologic: Grossly no focal neuro deficit. Bilaterally Equal motor strength  Data Reviewed: CBC: Recent Labs  Lab 07/20/18 0500 07/21/18 0555 07/22/18 0408 07/23/18 0301 07/24/18 0332  WBC 6.6 8.4 8.2 8.6 9.4  NEUTROABS  --   --  5.7  --   --   HGB 10.3* 11.3* 12.0* 12.6* 13.2  HCT 32.0* 34.8* 37.6* 39.5 40.5  MCV 90.4 88.8 88.7 89.0 88.2  PLT 119* 134* 160 213 261   Basic Metabolic Panel: Recent Labs  Lab 07/18/18 0422 07/19/18 0335 07/20/18 0500 07/21/18 0555 07/22/18 0408 07/23/18 0301 07/24/18 0332  NA 140 140 140 140 139 141 138  K 2.8* 3.2* 3.4* 3.3* 3.9 3.8 3.9  CL 106 109 108 106 108 109 108  CO2 26 25 23 23  21* 20* 22  GLUCOSE 147* 119* 92 95 98 76 97  BUN 15 13 10 12 13 15 16   CREATININE 1.02 0.75 0.70 0.74 0.84 0.80 0.88  CALCIUM 8.0* 7.8* 8.8* 9.1 9.3 9.4 9.6  MG 1.9 1.6* 1.7 2.0 2.0  --   --   PHOS  --  2.1* 4.6 4.6  --   --   --     Liver Function Tests: Recent Labs  Lab 07/19/18 0335 07/20/18 0500 07/21/18 0555 07/22/18 0408 07/23/18 0301 07/24/18 0332  AST 1,805*  --  216* 113* 73* 57*  ALT 3,032*  --  1,667* 1,197* 855* 642*  ALKPHOS 47  --  57 63 64 64  BILITOT 1.8*  --  2.0* 1.8* 1.5* 1.6*  PROT 4.6*  --  6.0* 6.7 6.7 7.4  ALBUMIN 2.4* 2.8* 3.1*  3.1* 3.3* 3.5 3.8   No results for input(s): LIPASE, AMYLASE in the last 168 hours. No results for input(s): AMMONIA in the last 168 hours. Coagulation Profile: Recent Labs  Lab 07/18/18 0422 07/19/18 0335 07/22/18 0408  INR 1.76 1.42 1.11   Cardiac Enzymes: Recent Labs  Lab 07/18/18 0422 07/18/18 1004 07/19/18 0335 07/22/18 0408  CKTOTAL 2,289*   --   --  277  TROPONINI 8.50* 6.41* 2.32*  --    BNP (last 3 results) No results for input(s): PROBNP in the last 8760 hours. CBG: Recent Labs  Lab 07/19/18 2006 07/20/18 0022 07/20/18 0439 07/20/18 0844 07/20/18  1217  GLUCAP 78 79 83 83 102*   Studies: No results found.   Time spent: 25 minutes  Author: Lynden Oxford, MD [facility] Pager: [phone] 07/24/2018 5:58 PM  If 7PM-7AM, please contact night-coverage at [email or web address], password [facility]

## 2018-07-25 ENCOUNTER — Ambulatory Visit: Admit: 2018-07-25 | Payer: Medicaid Other | Admitting: Orthopaedic Surgery

## 2018-07-25 LAB — COMPREHENSIVE METABOLIC PANEL
ALBUMIN: 3.8 g/dL (ref 3.5–5.0)
ALT: 485 U/L — ABNORMAL HIGH (ref 0–44)
ANION GAP: 8 (ref 5–15)
AST: 46 U/L — ABNORMAL HIGH (ref 15–41)
Alkaline Phosphatase: 65 U/L (ref 38–126)
BILIRUBIN TOTAL: 1.6 mg/dL — AB (ref 0.3–1.2)
BUN: 20 mg/dL (ref 6–20)
CHLORIDE: 106 mmol/L (ref 98–111)
CO2: 23 mmol/L (ref 22–32)
Calcium: 9.7 mg/dL (ref 8.9–10.3)
Creatinine, Ser: 0.97 mg/dL (ref 0.61–1.24)
GFR calc Af Amer: >60 mL/min (ref >60)
GFR calc non Af Amer: >60 mL/min (ref >60)
GLUCOSE: 96 mg/dL (ref 70–99)
POTASSIUM: 3.9 mmol/L (ref 3.5–5.1)
Sodium: 137 mmol/L (ref 135–145)
TOTAL PROTEIN: 7.6 g/dL (ref 6.5–8.1)

## 2018-07-25 LAB — TYPE AND SCREEN
ABO/RH(D): A NEG
Antibody Screen: NEGATIVE

## 2018-07-25 LAB — ABO/RH: ABO/RH(D): A NEG

## 2018-07-25 NOTE — Progress Notes (Signed)
Inpatient Rehabilitation  Continuing to follow for post acute rehab needs, note that patient has progressed to only requiring Min guard for transfers and gait. May do well enough to not need an IP Rehab stay.  However, await post-op therapy notes.    Charlane Ferretti., CCC/SLP Admission Coordinator  [facility]  Cell [phone]

## 2018-07-25 NOTE — Progress Notes (Signed)
[facility] Progress Note  Patient: Randy Mcintyre ZOX:[id number]   PCP: Patient, No Pcp Per DOB: January 01, 1996   DOA: 07/16/2018   DOS: 07/25/2018   Date of Service: the patient was seen and examined on 07/25/2018  Brief hospital course: Pt. with PMH of substance abuse, alcohol use; admitted on 07/16/2018, presented with complaint of unresponsive event, was found to have acute toxic and metabolic encephalopathy drug overdose, unintentional, acute kidney injury as well as liver dysfunction.  Patient was admitted to the ICU, intubated for airway protection.  Work-up found left lower lobe infiltrate with MRSA pneumonia.  Extubated on 07/19/2018 and transferred to stepdown on 07/21/2018 Currently further plan is await for the surgery scheduled on Wednesday and follow-up on safe discharge plan.  Subjective: No acute complaint.  No acute events overnight.  Assessment and Plan: 1.  Acute hypoxic respiratory failure. MRSA pneumonia. Acute metabolic and toxic encephalopathy. Patient is currently on room air. Needed intubation to protect his airway. ET aspirate grew MRSA.  Blood culture 1 out of 2 also grew gram-positive rods which appears to be contaminant but possible anaerobes as well. Patient was treated with IV vancomycin as well as I added IV Flagyl. Repeat cultures are so far no growth. Prior cultures have grown 1 out of 2 PROPRIONIBACTERIUM ACNES  likely contaminant.   discuss with ID regarding the significance of this organism. oral doxycycline to complete a total 10-day treatment course.  Discontinue Flagyl.  Last day of antibiotic on 07/26/2018.  2.  Acute metabolic and toxic encephalopathy. Presented with unresponsive event.  Needed intubation for airway protection. UDS is positive for positive for benzodiazepine, opiates, cocaine, marijuana. Patient was prescribed opiates and may have received benzodiazepine. Discussed with patient regarding cocaine and marijuana which he says he is not  aware how it showed up in his urine.  But he denies using this drugs. Subsequent repeat drug toxicity scan is positive for marijuana but negative for cocaine metabolites on 07/17/2018. Likely initial event was a combination of patient's polysubstance abuse as well as use of narcotic pain medication and gabapentin. Currently patient is mentating better. Continue to monitor.  3.  Acute kidney injury. Baseline renal function normal. On presentation serum creatinine was 2.68.  CK was also elevated.  Repeat CK trending down. Current creatinine is normal. Patient is voiding okay as well. Monitor. Encourage oral hydration.  4.  Elevated LFT. AST peaked at 9888, ALT peaked at 4628. Subsequently trending down, AST currently in 51, ALT currently 100s. Bilirubin total is 2.0. INR last checked was normal Ultrasound abdomen negative for any acute abnormality. Hepatitis work-up is also negative. Tylenol level was not significantly elevated. Suspected shock liver. Continue to avoid hepatotoxic medications.  5.  Recent left lower extremity trauma and fracture. Fall from stand, seen by Ortho outpatient and was scheduled to go for surgery on 07/17/2018. Currently surgery scheduled next Wednesday.  OR did not have enough time for requested surgery per orthopedic on Tuesday. We need to make sure that the patient is hemodynamically as well as from liver and kidney perspective stable to go through the surgery. Postoperative pain control would also be difficult.  For now recommending short course of treatment ranging from 2 to 3 days on discharge.  6.  Elevated troponin. ST segment changes. Troponin peaked at 8.50. Now trending down EKG shows T wave inversions in anterior leads as well as inferior leads. Echocardiogram shows 44 to 50% EF. Diffuse hypokinesis. Likely in response to shock,   7.  Hypokalemia Hypomagnesemia replaced.  8.  Thrombocytopenia. Acute, platelet dropped to 53. Currently  normal. Likely in response to sepsis.  Diet: Regular diet DVT Prophylaxis: subcutaneous Heparin  Advance goals of care discussion: Full code  Family Communication: family was present at bedside, at the time of interview. The pt provided permission to discuss medical plan with the family. Opportunity was given to ask question and all questions were answered satisfactorily.   Disposition:  Discharge to CIR.  Consultants: primary admission with PCCM, CIR, orthopedic Procedures: Echocardiogram  EEG  Scheduled Meds: . chlorhexidine  15 mL Mouth Rinse BID  . docusate sodium  100 mg Oral BID  . doxycycline  100 mg Oral Q12H  . enoxaparin (LOVENOX) injection  40 mg Subcutaneous Q24H  . folic acid  1 mg Oral Daily  . thiamine  100 mg Oral Daily   Continuous Infusions:  PRN Meds: ondansetron (ZOFRAN) IV, oxyCODONE, traMADol Antibiotics: Anti-infectives (From admission, onward)   Start     Dose/Rate Route Frequency Ordered Stop   07/23/18 1000  doxycycline (VIBRA-TABS) tablet 100 mg     100 mg Oral Every 12 hours 07/23/18 0749 07/26/18 0959   07/21/18 1630  metroNIDAZOLE (FLAGYL) IVPB 500 mg  Status:  Discontinued     500 mg 100 mL/hr over 60 Minutes Intravenous Every 8 hours 07/21/18 1528 07/22/18 1701   07/21/18 1000  aztreonam (AZACTAM) 1 g in sodium chloride 0.9 % 100 mL IVPB  Status:  Discontinued     1 g 200 mL/hr over 30 Minutes Intravenous Every 8 hours 07/21/18 0908 07/21/18 1529   07/19/18 1600  vancomycin (VANCOCIN) IVPB 1000 mg/200 mL premix     1,000 mg 200 mL/hr over 60 Minutes Intravenous Every 6 hours 07/19/18 1359 07/23/18 0148   07/19/18 0900  aztreonam (AZACTAM) 1 g in sodium chloride 0.9 % 100 mL IVPB  Status:  Discontinued     1 g 200 mL/hr over 30 Minutes Intravenous Every 8 hours 07/19/18 0844 07/19/18 1348   07/18/18 0830  vancomycin (VANCOCIN) IVPB 1000 mg/200 mL premix  Status:  Discontinued     1,000 mg 200 mL/hr over 60 Minutes Intravenous Every 8 hours  07/18/18 0820 07/19/18 1359   07/17/18 1600  vancomycin (VANCOCIN) 1,250 mg in sodium chloride 0.9 % 250 mL IVPB  Status:  Discontinued     1,250 mg 166.7 mL/hr over 90 Minutes Intravenous Every 24 hours 07/16/18 1436 07/17/18 0815   07/17/18 0900  vancomycin (VANCOCIN) IVPB 1000 mg/200 mL premix  Status:  Discontinued     1,000 mg 200 mL/hr over 60 Minutes Intravenous Every 12 hours 07/17/18 0815 07/18/18 0820   07/16/18 2300  aztreonam (AZACTAM) 1 g in sodium chloride 0.9 % 100 mL IVPB  Status:  Discontinued     1 g 200 mL/hr over 30 Minutes Intravenous Every 8 hours 07/16/18 1436 07/18/18 1508   07/16/18 1445  vancomycin (VANCOCIN) 1,750 mg in sodium chloride 0.9 % 500 mL IVPB     1,750 mg 250 mL/hr over 120 Minutes Intravenous  Once 07/16/18 1436 07/16/18 1844   07/16/18 1430  vancomycin (VANCOCIN) IVPB 1000 mg/200 mL premix  Status:  Discontinued     1,000 mg 200 mL/hr over 60 Minutes Intravenous  Once 07/16/18 1429 07/16/18 1436   07/16/18 1430  aztreonam (AZACTAM) 2 g in sodium chloride 0.9 % 100 mL IVPB     2 g 200 mL/hr over 30 Minutes Intravenous  Once 07/16/18 1429 07/17/18 0028  Objective: Physical Exam: Vitals:   07/24/18 1709 07/25/18 0010 07/25/18 0852 07/25/18 1658  BP: 119/76 125/78 109/76 129/72  Pulse: (!) 101 99 90 89  Resp: 18 14 18 18   Temp:  98.3 F (36.8 C) 98.3 F (36.8 C) 98.4 F (36.9 C)  TempSrc:   Oral Oral  SpO2: 99% 99% 97% 97%  Weight:      Height:        Intake/Output Summary (Last 24 hours) at 07/25/2018 1847 Last data filed at 07/25/2018 0012 Gross per 24 hour  Intake 220 ml  Output -  Net 220 ml   Filed Weights   07/17/18 0936  Weight: 90.7 kg   General: Alert, Awake and Oriented to Time, Place and Person. Appear in mild distress, affect flat Eyes: PERRL, Conjunctiva normal ENT: Oral Mucosa clear moist. Neck: no JVD, no Abnormal Mass Or lumps Cardiovascular: S1 and S2 Present, no Murmur, Peripheral Pulses  Present Respiratory: normal respiratory effort, Bilateral Air entry equal and Decreased, no use of accessory muscle, Clear to Auscultation, no Crackles, no wheezes Abdomen: Bowel Sound present, Soft and no tenderness, no hernia Skin: no redness, no Rash, no induration Extremities: no Pedal edema, no calf tenderness Neurologic: Grossly no focal neuro deficit. Bilaterally Equal motor strength  Data Reviewed: CBC: Recent Labs  Lab 07/20/18 0500 07/21/18 0555 07/22/18 0408 07/23/18 0301 07/24/18 0332  WBC 6.6 8.4 8.2 8.6 9.4  NEUTROABS  --   --  5.7  --   --   HGB 10.3* 11.3* 12.0* 12.6* 13.2  HCT 32.0* 34.8* 37.6* 39.5 40.5  MCV 90.4 88.8 88.7 89.0 88.2  PLT 119* 134* 160 213 261   Basic Metabolic Panel: Recent Labs  Lab 07/19/18 0335 07/20/18 0500 07/21/18 0555 07/22/18 0408 07/23/18 0301 07/24/18 0332 07/25/18 0402  NA 140 140 140 139 141 138 137  K 3.2* 3.4* 3.3* 3.9 3.8 3.9 3.9  CL 109 108 106 108 109 108 106  CO2 25 23 23  21* 20* 22 23  GLUCOSE 119* 92 95 98 76 97 96  BUN 13 10 12 13 15 16 20   CREATININE 0.75 0.70 0.74 0.84 0.80 0.88 0.97  CALCIUM 7.8* 8.8* 9.1 9.3 9.4 9.6 9.7  MG 1.6* 1.7 2.0 2.0  --   --   --   PHOS 2.1* 4.6 4.6  --   --   --   --     Liver Function Tests: Recent Labs  Lab 07/21/18 0555 07/22/18 0408 07/23/18 0301 07/24/18 0332 07/25/18 0402  AST 216* 113* 73* 57* 46*  ALT 1,667* 1,197* 855* 642* 485*  ALKPHOS 57 63 64 64 65  BILITOT 2.0* 1.8* 1.5* 1.6* 1.6*  PROT 6.0* 6.7 6.7 7.4 7.6  ALBUMIN 3.1*  3.1* 3.3* 3.5 3.8 3.8   No results for input(s): LIPASE, AMYLASE in the last 168 hours. No results for input(s): AMMONIA in the last 168 hours. Coagulation Profile: Recent Labs  Lab 07/19/18 0335 07/22/18 0408  INR 1.42 1.11   Cardiac Enzymes: Recent Labs  Lab 07/19/18 0335 07/22/18 0408  CKTOTAL  --  277  TROPONINI 2.32*  --    BNP (last 3 results) No results for input(s): PROBNP in the last 8760 hours. CBG: Recent Labs   Lab 07/19/18 2006 07/20/18 0022 07/20/18 0439 07/20/18 0844 07/20/18 1217  GLUCAP 78 79 83 83 102*   Studies: No results found.   Time spent: 25 minutes  Author: Lynden Oxford, MD [facility] Pager: [phone] 07/25/2018 6:47  PM  If 7PM-7AM, please contact night-coverage at [email or web address], password [facility]

## 2018-07-26 ENCOUNTER — Inpatient Hospital Stay (HOSPITAL_COMMUNITY): Payer: Medicaid Other | Admitting: Certified Registered Nurse Anesthetist

## 2018-07-26 ENCOUNTER — Inpatient Hospital Stay (HOSPITAL_COMMUNITY): Payer: Medicaid Other

## 2018-07-26 ENCOUNTER — Encounter (HOSPITAL_COMMUNITY)
Admission: EM | Disposition: A | Discharge status: Home / Self Care | Payer: Self-pay | Source: Home / Self Care | Attending: Internal Medicine

## 2018-07-26 ENCOUNTER — Encounter (HOSPITAL_COMMUNITY): Payer: Self-pay | Admitting: Anesthesiology

## 2018-07-26 DIAGNOSIS — Z452 Encounter for adjustment and management of vascular access device: Secondary | ICD-10-CM

## 2018-07-26 HISTORY — PX: OPEN REDUCTION INTERNAL FIXATION (ORIF) FOOT LISFRANC FRACTURE: SHX5990

## 2018-07-26 LAB — COMPREHENSIVE METABOLIC PANEL
ALBUMIN: 3.5 g/dL (ref 3.5–5.0)
ALK PHOS: 65 U/L (ref 38–126)
ALT: 305 U/L — ABNORMAL HIGH (ref 0–44)
ANION GAP: 8 (ref 5–15)
AST: 36 U/L (ref 15–41)
BUN: 16 mg/dL (ref 6–20)
CALCIUM: 9.5 mg/dL (ref 8.9–10.3)
CHLORIDE: 106 mmol/L (ref 98–111)
CO2: 26 mmol/L (ref 22–32)
Creatinine, Ser: 0.92 mg/dL (ref 0.61–1.24)
GFR calc non Af Amer: >60 mL/min (ref >60)
GLUCOSE: 125 mg/dL — AB (ref 70–99)
Potassium: 3.7 mmol/L (ref 3.5–5.1)
SODIUM: 140 mmol/L (ref 135–145)
Total Bilirubin: 1 mg/dL (ref 0.3–1.2)
Total Protein: 7.2 g/dL (ref 6.5–8.1)

## 2018-07-26 LAB — CBC WITH DIFFERENTIAL/PLATELET
ABS IMMATURE GRANULOCYTES: 0.04 10*3/uL (ref 0.00–0.07)
Basophils Absolute: 0.1 10*3/uL (ref 0.0–0.1)
Basophils Relative: 1 %
EOS ABS: 0.1 10*3/uL (ref 0.0–0.5)
Eosinophils Relative: 2 %
HEMATOCRIT: 40 % (ref 39.0–52.0)
HEMOGLOBIN: 13 g/dL (ref 13.0–17.0)
Immature Granulocytes: 1 %
LYMPHS PCT: 43 %
Lymphs Abs: 3.5 10*3/uL (ref 0.7–4.0)
MCH: 28.9 pg (ref 26.0–34.0)
MCHC: 32.5 g/dL (ref 30.0–36.0)
MCV: 88.9 fL (ref 80.0–100.0)
MONOS PCT: 8 %
Monocytes Absolute: 0.7 10*3/uL (ref 0.1–1.0)
NEUTROS ABS: 3.8 10*3/uL (ref 1.7–7.7)
NEUTROS PCT: 45 %
Platelets: 353 10*3/uL (ref 150–400)
RBC: 4.5 MIL/uL (ref 4.22–5.81)
RDW: 12.5 % (ref 11.5–15.5)
WBC: 8.1 10*3/uL (ref 4.0–10.5)
nRBC: 0 % (ref 0.0–0.2)

## 2018-07-26 LAB — SURGICAL PCR SCREEN
MRSA, PCR: NEGATIVE
STAPHYLOCOCCUS AUREUS: NEGATIVE

## 2018-07-26 LAB — CULTURE, BLOOD (ROUTINE X 2)
CULTURE: NO GROWTH
Culture: NO GROWTH
SPECIAL REQUESTS: ADEQUATE
SPECIAL REQUESTS: ADEQUATE

## 2018-07-26 SURGERY — OPEN REDUCTION INTERNAL FIXATION (ORIF) FOOT LISFRANC FRACTURE
Anesthesia: General | Site: Foot | Laterality: Left

## 2018-07-26 MED ORDER — ONDANSETRON HCL 4 MG/2ML IJ SOLN
INTRAMUSCULAR | Status: DC | PRN
  Administered 2018-07-26: 4 mg via INTRAVENOUS

## 2018-07-26 MED ORDER — CLINDAMYCIN PHOSPHATE 900 MG/50ML IV SOLN
INTRAVENOUS | Status: DC | PRN
  Administered 2018-07-26: 900 mg via INTRAVENOUS

## 2018-07-26 MED ORDER — FENTANYL CITRATE (PF) 100 MCG/2ML IJ SOLN
INTRAMUSCULAR | Status: DC | PRN
  Administered 2018-07-26: 25 ug via INTRAVENOUS
  Administered 2018-07-26 (×3): 50 ug via INTRAVENOUS
  Administered 2018-07-26: 25 ug via INTRAVENOUS

## 2018-07-26 MED ORDER — DEXAMETHASONE SODIUM PHOSPHATE 10 MG/ML IJ SOLN
INTRAMUSCULAR | Status: DC | PRN
  Administered 2018-07-26: 10 mg via INTRAVENOUS

## 2018-07-26 MED ORDER — LIDOCAINE HCL (CARDIAC) PF 100 MG/5ML IV SOSY
PREFILLED_SYRINGE | INTRAVENOUS | Status: DC | PRN
  Administered 2018-07-26: 40 mg via INTRAVENOUS

## 2018-07-26 MED ORDER — ONDANSETRON HCL 4 MG/2ML IJ SOLN
INTRAMUSCULAR | Status: AC
  Filled 2018-07-26: qty 2

## 2018-07-26 MED ORDER — MEPERIDINE HCL 50 MG/ML IJ SOLN: 6.2500 mg | INTRAMUSCULAR | Status: DC | PRN

## 2018-07-26 MED ORDER — PROPOFOL 10 MG/ML IV BOLUS
INTRAVENOUS | Status: DC | PRN
  Administered 2018-07-26: 300 mg via INTRAVENOUS

## 2018-07-26 MED ORDER — LACTATED RINGERS IV SOLN
INTRAVENOUS | Status: DC | PRN
  Administered 2018-07-26 (×2): via INTRAVENOUS

## 2018-07-26 MED ORDER — BUPIVACAINE-EPINEPHRINE (PF) 0.5% -1:200000 IJ SOLN
INTRAMUSCULAR | Status: DC | PRN
  Administered 2018-07-26 (×2): 25 mL

## 2018-07-26 MED ORDER — LACTATED RINGERS IV SOLN: INTRAVENOUS | Status: DC

## 2018-07-26 MED ORDER — MIDAZOLAM HCL 2 MG/2ML IJ SOLN
INTRAMUSCULAR | Status: AC
  Filled 2018-07-26: qty 2

## 2018-07-26 MED ORDER — HYDROMORPHONE HCL 1 MG/ML IJ SOLN
INTRAMUSCULAR | Status: AC
  Filled 2018-07-26: qty 1

## 2018-07-26 MED ORDER — 0.9 % SODIUM CHLORIDE (POUR BTL) OPTIME
TOPICAL | Status: DC | PRN
  Administered 2018-07-26: 1000 mL

## 2018-07-26 MED ORDER — PROPOFOL 10 MG/ML IV BOLUS
INTRAVENOUS | Status: AC
  Filled 2018-07-26: qty 20

## 2018-07-26 MED ORDER — CLINDAMYCIN PHOSPHATE 900 MG/50ML IV SOLN
INTRAVENOUS | Status: AC
  Filled 2018-07-26: qty 50

## 2018-07-26 MED ORDER — EPHEDRINE 5 MG/ML INJ
INTRAVENOUS | Status: AC
  Filled 2018-07-26: qty 10

## 2018-07-26 MED ORDER — MIDAZOLAM HCL 5 MG/5ML IJ SOLN
INTRAMUSCULAR | Status: DC | PRN
  Administered 2018-07-26: 2 mg via INTRAVENOUS

## 2018-07-26 MED ORDER — FENTANYL CITRATE (PF) 250 MCG/5ML IJ SOLN
INTRAMUSCULAR | Status: AC
  Filled 2018-07-26: qty 5

## 2018-07-26 MED ORDER — MUPIROCIN 2 % EX OINT
1.0000 "application " | TOPICAL_OINTMENT | Freq: Two times a day (BID) | CUTANEOUS | Status: DC
  Administered 2018-07-26: 1 via TOPICAL
  Filled 2018-07-26 (×2): qty 22

## 2018-07-26 MED ORDER — PROMETHAZINE HCL 25 MG/ML IJ SOLN: 6.2500 mg | INTRAMUSCULAR | Status: DC | PRN

## 2018-07-26 MED ORDER — HYDROMORPHONE HCL 1 MG/ML IJ SOLN
0.2500 mg | INTRAMUSCULAR | Status: DC | PRN
  Administered 2018-07-26 (×3): 0.5 mg via INTRAVENOUS

## 2018-07-26 SURGICAL SUPPLY — 71 items
1.1 long kwire ×15 IMPLANT
ALCOHOL 70% 16 OZ (MISCELLANEOUS) ×3 IMPLANT
BANDAGE ESMARK 6X9 LF (GAUZE/BANDAGES/DRESSINGS) ×1 IMPLANT
BIT DRILL CANN F/COMP 2.2 (BIT) ×3 IMPLANT
BLADE 15 SAFETY STRL DISP (BLADE) ×6 IMPLANT
BLADE SURG 15 STRL LF DISP TIS (BLADE) ×4 IMPLANT
BLADE SURG 15 STRL SS (BLADE) ×8
BNDG COHESIVE 4X5 TAN STRL (GAUZE/BANDAGES/DRESSINGS) IMPLANT
BNDG COHESIVE 6X5 TAN STRL LF (GAUZE/BANDAGES/DRESSINGS) ×3 IMPLANT
BNDG ELASTIC 6X15 VLCR STRL LF (GAUZE/BANDAGES/DRESSINGS) ×6 IMPLANT
BNDG ESMARK 6X9 LF (GAUZE/BANDAGES/DRESSINGS) ×3
CANISTER SUCT 3000ML PPV (MISCELLANEOUS) ×3 IMPLANT
CAP PIN PROTECTOR ORTHO WHT (CAP) ×12 IMPLANT
CHLORAPREP W/TINT 26ML (MISCELLANEOUS) ×6 IMPLANT
COVER SURGICAL LIGHT HANDLE (MISCELLANEOUS) ×3 IMPLANT
COVER WAND RF STERILE (DRAPES) ×3 IMPLANT
CUFF TOURNIQUET SINGLE 34IN LL (TOURNIQUET CUFF) ×3 IMPLANT
CUFF TOURNIQUET SINGLE 44IN (TOURNIQUET CUFF) IMPLANT
DRAPE C-ARM 35X43 STRL (DRAPES) ×3 IMPLANT
DRAPE C-ARMOR (DRAPES) ×3 IMPLANT
DRAPE OEC MINIVIEW 54X84 (DRAPES) IMPLANT
DRAPE U-SHAPE 47X51 STRL (DRAPES) ×3 IMPLANT
DRSG MEPITEL 4X7.2 (GAUZE/BANDAGES/DRESSINGS) ×3 IMPLANT
DRSG PAD ABDOMINAL 8X10 ST (GAUZE/BANDAGES/DRESSINGS) ×6 IMPLANT
ELECT REM PT RETURN 9FT ADLT (ELECTROSURGICAL) ×3
ELECTRODE REM PT RTRN 9FT ADLT (ELECTROSURGICAL) ×1 IMPLANT
GAUZE SPONGE 4X4 12PLY STRL (GAUZE/BANDAGES/DRESSINGS) IMPLANT
GAUZE SPONGE 4X4 12PLY STRL LF (GAUZE/BANDAGES/DRESSINGS) ×3 IMPLANT
GAUZE XEROFORM 5X9 LF (GAUZE/BANDAGES/DRESSINGS) ×3 IMPLANT
GLOVE BIO SURGEON STRL SZ 6.5 (GLOVE) ×4 IMPLANT
GLOVE BIO SURGEON STRL SZ7 (GLOVE) ×3 IMPLANT
GLOVE BIO SURGEON STRL SZ7.5 (GLOVE) ×6 IMPLANT
GLOVE BIO SURGEONS STRL SZ 6.5 (GLOVE) ×2
GLOVE BIOGEL PI IND STRL 7.0 (GLOVE) ×1 IMPLANT
GLOVE BIOGEL PI IND STRL 7.5 (GLOVE) ×1 IMPLANT
GLOVE BIOGEL PI IND STRL 8 (GLOVE) ×2 IMPLANT
GLOVE BIOGEL PI INDICATOR 7.0 (GLOVE) ×2
GLOVE BIOGEL PI INDICATOR 7.5 (GLOVE) ×2
GLOVE BIOGEL PI INDICATOR 8 (GLOVE) ×4
GLOVE ECLIPSE 8.0 STRL XLNG CF (GLOVE) ×3 IMPLANT
GOWN STRL REUS W/ TWL LRG LVL3 (GOWN DISPOSABLE) ×4 IMPLANT
GOWN STRL REUS W/ TWL XL LVL3 (GOWN DISPOSABLE) ×1 IMPLANT
GOWN STRL REUS W/TWL LRG LVL3 (GOWN DISPOSABLE) ×8
GOWN STRL REUS W/TWL XL LVL3 (GOWN DISPOSABLE) ×2
GUIDEWIRE .045IN 1.14MM (WIRE) ×6 IMPLANT
GUIDEWIRE .045X6.84IN (WIRE) ×15 IMPLANT
KIT BASIN OR (CUSTOM PROCEDURE TRAY) ×3 IMPLANT
KIT TURNOVER KIT B (KITS) ×3 IMPLANT
NS IRRIG 1000ML POUR BTL (IV SOLUTION) ×3 IMPLANT
PACK ORTHO EXTREMITY (CUSTOM PROCEDURE TRAY) ×3 IMPLANT
PAD ABD 8X10 STRL (GAUZE/BANDAGES/DRESSINGS) ×3 IMPLANT
PAD ARMBOARD 7.5X6 YLW CONV (MISCELLANEOUS) ×6 IMPLANT
PAD CAST 4YDX4 CTTN HI CHSV (CAST SUPPLIES) ×1 IMPLANT
PADDING CAST COTTON 4X4 STRL (CAST SUPPLIES) ×2
PIN CAPS ORTHO GREEN .062 (PIN) ×3 IMPLANT
SCREW LP TIT 3.5X30 (Screw) ×3 IMPLANT
SPLINT FIBERGLASS 4X30 (CAST SUPPLIES) ×3 IMPLANT
SPONGE LAP 18X18 X RAY DECT (DISPOSABLE) ×3 IMPLANT
SUCTION FRAZIER HANDLE 10FR (MISCELLANEOUS) ×2
SUCTION TUBE FRAZIER 10FR DISP (MISCELLANEOUS) ×1 IMPLANT
SUT ETHILON 3 0 PS 1 (SUTURE) ×9 IMPLANT
SUT MNCRL AB 3-0 PS2 18 (SUTURE) ×6 IMPLANT
SUT PDS AB 2-0 CT1 27 (SUTURE) ×6 IMPLANT
SUT VIC AB 2-0 CT1 27 (SUTURE) ×4
SUT VIC AB 2-0 CT1 TAPERPNT 27 (SUTURE) ×2 IMPLANT
TIGHTROPE REPAIR MINI (Orthopedic Implant) ×2 IMPLANT
TIGHTROPE REPAIR MINI .45 (Orthopedic Implant) ×1 IMPLANT
TOWEL OR 17X24 6PK STRL BLUE (TOWEL DISPOSABLE) ×3 IMPLANT
TOWEL OR 17X26 10 PK STRL BLUE (TOWEL DISPOSABLE) ×3 IMPLANT
TUBE CONNECTING 12'X1/4 (SUCTIONS) ×1
TUBE CONNECTING 12X1/4 (SUCTIONS) ×2 IMPLANT

## 2018-07-26 NOTE — Anesthesia Postprocedure Evaluation (Signed)
Anesthesia Post Note  Patient: Randy Mcintyre  Procedure(s) Performed: OPEN REDUCTION INTERNAL FIXATION (ORIF) FOOT LISFRANC  and metatarsal FRACTUREs (Left Foot)     Patient location during evaluation: PACU Anesthesia Type: General Level of consciousness: awake and alert Pain management: pain level controlled Vital Signs Assessment: post-procedure vital signs reviewed and stable Respiratory status: spontaneous breathing, nonlabored ventilation, respiratory function stable and patient connected to nasal cannula oxygen Cardiovascular status: blood pressure returned to baseline and stable Postop Assessment: no apparent nausea or vomiting Anesthetic complications: no    Last Vitals:  Vitals:   07/26/18 1655 07/26/18 1710  BP: 132/83 124/83  Pulse: (!) 108 (!) 111  Resp: 15 15  Temp:  36.5 C  SpO2: 98% 98%                 Shelton Silvas

## 2018-07-26 NOTE — Op Note (Addendum)
Randy Mcintyre male 22 y.o. 07/26/2018  PreOperative Diagnosis: Left foot first metatarsal base fracture with first TMT instability Lisfranc fracture dislocation Second TMT dislocation Middle cuneiform fracture Second metatarsal base fracture Third metatarsal base fracture Fourth metatarsal base fracture Lateral cuneiform fracture Cuboid fracture  2 through 5 metatarsal neck fractures Medial and middle intercuneiform disruption  PostOperative Diagnosis: Same   Procedure(s) and Anesthesia Type: Open treatment of Lisfranc fracture dislocation Open treatment of intercuneiform disruption Open treatment with internal fixation of first TMT joint Open treatment of the second TMT joint Open treatment of fourth TMT joint with internal fixation Open treatment of 2, 3, 4, 5 metatarsal neck fractures Closed treatment of cuboid fracture Closed treatment of third metatarsal base fracture Closed treatment of lateral cuneiform fracture  Surgeon: Terance Hart   Assistants: April RNFA  Anesthesia: General LMA anesthesia   Findings: See above postoperative diagnosis Fourth metatarsal was plantarflexed through the plantar foot tissues into the fat pad.  Implants:  Arthrex tight rope, Arthrex 3.5 mm cortical screw K wires  Indications:22 y.o. male who fell 20 feet from a deer stand.  This occurred 2 weeks ago.  He was initially seen in the emergency department and sent to me for treatment.  Upon evaluation he had the above mentioned injuries and was indicated for the above procedures.  We had a lengthy discussion about the risks, benefits and alternatives to surgery which she understood.  The risks included but were not limited to wound healing complications, infection, malunion or nonunion, need for second surgery, damage to surrounding structures, persistent pain, less than optimal outcome as well as anesthesia risks including death.  After weighing these risks and benefits to the  patient decided to undergo surgical treatment.  He was initially scheduled as an outpatient however had an overdose episode with cocaine, meloxicam, oxycodone, marijuana and was admitted to the ICU intubated.  The original surgery was delayed.  Once he was cleared by the medical team he was scheduled.  Procedure Detail: The patient was identified in the preoperative holding area.  The left lower extremity was marked by myself.  The consent was signed.  Discussion was had about the proposed surgical procedure and all questions were answered.  A peripheral nerve block was done by anesthesia.  He was taken to the operative suite and placed supine on operative table.  General LMA anesthesia was induced without difficulty.  All bony prominences were well-padded.  Clindamycin antibiotics given.  Left hip was bumped.  Thigh tourniquet was placed.  Left lower extremity was prepped and draped in usual sterile fashion.  A timeout was performed indicating the left lower extremity as the appropriate upper extremity and the correct surgical procedure.  I began by making a 5 cm incision centered over the second TMT joint.  This taken sharply down through skin and subcutaneous tissues.  Then blunt dissection was used to identify the EHB muscle belly and tendon which was mobilized.  I then identified the EHL tendon and the neurovascular bundle.  The EHL tendon was mobilized and moved medially and the neurovascular bundle was mobilized subperiosteally.  This was protected for the entirety of the case.  I was unable to identify the Lisfranc joint which was grossly unstable.  Also identified the second TMT joint which again was grossly unstable.  The cartilage that was visible from the dorsal aspect appeared to be intact.  There is some comminuted fracture fragments plantarly but overall the cartilage looked well-maintained.  I was able  to identify the intercuneiform joint which also was disrupted.  There was some fracture  hematoma that was cleared out of the Lisfranc joint, the second TMT joint and the intercuneiform joints.  This was to allow for good visualization of these joints.  Then used a pointed reduction forcep I was able to reduce the intercuneiform joint.  I was able to reduce the middle cuneiform and the second metatarsal base reducing the second TMT joint.  Then a K wire was placed to hold this provisionally.  Then the Lisfranc joint was reduced with a pointed reduction forcep as well.  Then a 3.75mm cortical screw was placed across the intercuneiform joint.  Then a tight rope was placed across the Lisfranc joint completing the open treatment of the Lisfranc fracture dislocation.  The first TMT joint was then inspected and found to be unstable.  This joint was cleaned out of any hematoma and small cartilaginous pieces.  I then turned my attention to the lateral TMT joints.  An incision was carried out over the fourth TMT joint and along the fourth metatarsal.  The fourth metatarsal was severely plantarflexed and therefore the incision was taken down along the fourth metatarsal.  I was able to incise sharply through skin and subcutaneous tissue and then used blunt dissection to identify the fourth TMT joint.  The fourth TMT joint was disrupted and the joint was grossly unstable.  The plantar fracture fragments were not visualized in the dorsal chondral surfaces appeared to be intact.  The extensor tendons were mobilized along the fourth metatarsal and a subperiosteal dissection was made.  I was unable to free up the distal aspect of the fourth metatarsal which was found to be punctured through the plantar fascial tissue.  Then using a small Hohmann retractor was able to lever this up.  This was then reduced with the fourth metatarsal head.  A K wire was run in antegrade fashion through the fourth metatarsal head out the plantar skin.  Then the fourth metatarsal head was reduced onto the fourth metatarsal shaft and the pin  was run up the shaft in a retrograde manner.  This appropriate reduction was confirmed on fluoroscopy.  Then the fifth metatarsal was identified in the overlying soft tissue mobilized and retracted.  Then a subperiosteal dissection of the distal aspect of the fifth metatarsal was done.  I was able to identify the fracture of the fifth metatarsal neck and the and a K wire again was ran in an antegrade fashion through the fifth metatarsal head out the plantar skin and run retrograde once reduction of the fifth metatarsal head on the shaft was done.  Again this appropriate reduction was confirmed on fluoroscopy.  Then a third incision was made in between the second and third webspace.  This was taken sharply down through skin and subcutaneous tissues and using blunt dissection was able to identify the extensor digitorum tendons which were mobilized.  Then I was able to identify the third metatarsal and a subperiosteal dissection was done of the distal aspect.  Is able to identify the metatarsal neck fracture the third metatarsal and mobilized the metatarsal head and the distal aspect of the metatarsal shaft.  Then a K wire was run through the third metatarsal head of the plantar skin and anterograde fashion.  This was then taken up retrograde in the shaft once reduction of the third metatarsal head on the third metatarsal shaft was completed.  Then was able to use sharp dissection to  identify the extensor tendons to the second toe and mobilize these.  Then using some periosteal dissection of the distal aspect of the second metatarsal was performed.  Is able to identify the fracture site there.  Then again mobilizing fracture and cleaning out the fracture hematoma I was able to run a K wire in antegrade fashion out the second metatarsal head through the plantar skin.  This was then taken retrograde up the metatarsal shaft once appropriate reduction of the metatarsal head onto the second metatarsal shaft.  This completed  the open treatment of the 2 through 5 metatarsal neck fractures.  I then turned my attention back to the fourth TMT joint and found this to be grossly unstable.  There is some dorsal widening there.  A K wire was run in a retrograde fashion percutaneously across the fourth TMT joint stabilizing this.  I then turned my attention back to the first TMT joint.  This was again found to be unstable.  Using manual manipulation I was able to reduce this joint and percutaneously place a K wire across the first TMT joint holding it in an appropriate position.  All of the K wire positions and screw lengths were checked on intraoperative fluoroscopy and found to be acceptable.  Final x-rays were taken.  Then all the wounds were irrigated with copious amounts of sterile saline.  The tourniquet was released and hemostasis was obtained.  2-0 PDS suture was used for the deep tissues.  3-0 Monocryl was used for the subcutaneous tissue and 3-0 nylon for the skin.  Xeroform was placed around the pin sites and the pins were clipped and bent and pin caps were placed.  Then Xeroform was placed on the wounds.  4 x 4's and ABD pads were placed in a short leg splint.  At the end of the case all counts were correct.  There were no complications.  Post Op Instructions: He will remain nonweightbearing in the left lower extremity Keep the splint in place at all times. He will follow-up in 2 weeks for splint removal, x-rays and wound check. We will leave the pins in place for a total of 6 weeks. He will be transferred back to the inpatient unit and be discharged from the hospital when appropriate per medical team.  Tourniquette Time:157min  Estimated Blood Loss:  less than 50 mL         Drains: none  Blood Given: none         Specimens: none       Complications:  * No complications entered in OR log *         Disposition: PACU - hemodynamically stable.         Condition: stable

## 2018-07-26 NOTE — Transfer of Care (Signed)
Immediate Anesthesia Transfer of Care Note  Patient: Randy Mcintyre  Procedure(s) Performed: OPEN REDUCTION INTERNAL FIXATION (ORIF) FOOT LISFRANC  and metatarsal FRACTUREs (Left Foot)  Patient Location: PACU  Anesthesia Type:General  Level of Consciousness: awake and alert   Airway & Oxygen Therapy: Patient Spontanous Breathing  Post-op Assessment: Report given to RN  Post vital signs: Reviewed and stable  Last Vitals:  Vitals Value Taken Time  BP    Temp    Pulse 111 07/26/2018  4:10 PM  Resp 19 07/26/2018  4:10 PM  SpO2 99 % 07/26/2018  4:10 PM  Vitals shown include unvalidated device data.  Last Pain:  Vitals:   07/26/18 0800  TempSrc:   PainSc: 0-No pain      Patients Stated Pain Goal: 0 (07/21/18 2015)  Complications: No apparent anesthesia complications

## 2018-07-26 NOTE — Anesthesia Procedure Notes (Signed)
Procedure Name: LMA Insertion Date/Time: 07/26/2018 1:07 PM Performed by: Rogelia Boga, CRNA Pre-anesthesia Checklist: Patient identified, Emergency Drugs available, Suction available and Patient being monitored Patient Re-evaluated:Patient Re-evaluated prior to induction Oxygen Delivery Method: Circle System Utilized Preoxygenation: Pre-oxygenation with 100% oxygen Induction Type: IV induction Ventilation: Mask ventilation without difficulty LMA: LMA inserted LMA Size: 5.0 Number of attempts: 1 Airway Equipment and Method: Bite block Placement Confirmation: positive ETCO2 Tube secured with: Tape Dental Injury: Teeth and Oropharynx as per pre-operative assessment

## 2018-07-26 NOTE — Anesthesia Procedure Notes (Signed)
Anesthesia Regional Block: Popliteal block   Pre-Anesthetic Checklist: ,, timeout performed, Correct Patient, Correct Site, Correct Laterality, Correct Procedure, Correct Position, site marked, Risks and benefits discussed,  Surgical consent,  Pre-op evaluation,  At surgeon's request and post-op pain management  Laterality: Left  Prep: chloraprep       Needles:  Injection technique: Single-shot  Needle Type: Echogenic Stimulator Needle     Needle Length: 9cm  Needle Gauge: 21     Additional Needles:   Procedures:,,,, ultrasound used (permanent image in chart),,,,  Narrative:  Start time: 07/26/2018 12:37 PM End time: 07/26/2018 12:42 PM Injection made incrementally with aspirations every 5 mL.  Performed by: Personally  Anesthesiologist: Shelton Silvas, MD  Additional Notes: Patient tolerated the procedure well. Local anesthetic introduced in an incremental fashion under minimal resistance after negative aspirations. No paresthesias were elicited. After completion of the procedure, no acute issues were identified and patient continued to be monitored by RN.

## 2018-07-26 NOTE — Progress Notes (Signed)
[facility] Progress Note  Patient: Randy Mcintyre ZOX:[id number]   PCP: Patient, No Pcp Per DOB: 1996/09/14   DOA: 07/16/2018   DOS: 07/26/2018   Date of Service: the patient was seen and examined on 07/26/2018  Brief hospital course: Pt. with PMH of substance abuse, alcohol use; admitted on 07/16/2018, presented with complaint of unresponsive event, was found to have acute toxic and metabolic encephalopathy drug overdose, unintentional, acute kidney injury as well as liver dysfunction.  Patient was admitted to the ICU, intubated for airway protection.  Work-up found left lower lobe infiltrate with MRSA pneumonia.  Extubated on 07/19/2018 and transferred to stepdown on 07/21/2018 Currently further plan is await for the surgery scheduled on Wednesday  Subjective: As well, no issues overnight, awaiting surgery  Assessment and Plan: 1.  Acute hypoxic respiratory failure/due to overdose, toxic encephalopathy -Requiring ventilator dependent respiratory failure -Complicated by MRSA pneumonia -Now improved, extubated, tracheal aspirate grew MRSA and 1 out of 2 blood cultures grew gram-positive rods which was felt to be a contaminant -Treated with vancomycin and Flagyl -Antibiotics subsequently transitioned by Dr. Allena Katz to oral doxycycline to complete 7 to 10-day course -Improved and stable from the standpoint  2.  Acute metabolic and toxic encephalopathy. Presented with unresponsiveness with an empty pill bottle -Needed intubation for airway protection. UDS is positive for positive for benzodiazepine, opiates, cocaine, marijuana. -Counseled  3.  Acute kidney injury. -Resolved with hydration  4.  Shock liver -LFTs improving, trending down  5.  Recent left lower extremity trauma and fracture. -seen by Ortho outpatient and was scheduled to go for surgery on 07/17/2018. -OR today -Home tomorrow if stable postop  6.  Elevated troponin with ST segment changes. -Seen on admission to ICU,  Troponin peaked at 8.50. -EKG showed T wave inversions in anterior leads as well as inferior leads. -Echocardiogram shows 44 to 50% EF with Diffuse hypokinesis. -likely due to shock on admission, multisystem failure -Follow-up with PCP, repeat echocardiogram in 2 months  7.  Hypokalemia Hypomagnesemia replaced.  8.  Thrombocytopenia. Acute, platelet dropped to 53. Currently normal.  Diet: Regular diet DVT Prophylaxis: subcutaneous Heparin  Advance goals of care discussion: Full code  Family Communication: Fianc at bedside  Disposition: Home tomorrow if stable  Consultants: primary admission with PCCM, CIR, orthopedic Procedures: Echocardiogram  EEG  Scheduled Meds: . [MAR Hold] chlorhexidine  15 mL Mouth Rinse BID  . [MAR Hold] docusate sodium  100 mg Oral BID  . [MAR Hold] enoxaparin (LOVENOX) injection  40 mg Subcutaneous Q24H  . [MAR Hold] folic acid  1 mg Oral Daily  . [MAR Hold] thiamine  100 mg Oral Daily   Continuous Infusions:  PRN Meds: 0.9 % irrigation (POUR BTL), [MAR Hold] ondansetron (ZOFRAN) IV, [MAR Hold] oxyCODONE, [MAR Hold] traMADol Antibiotics: Anti-infectives (From admission, onward)   Start     Dose/Rate Route Frequency Ordered Stop   07/23/18 1000  doxycycline (VIBRA-TABS) tablet 100 mg     100 mg Oral Every 12 hours 07/23/18 0749 07/25/18 2210   07/21/18 1630  metroNIDAZOLE (FLAGYL) IVPB 500 mg  Status:  Discontinued     500 mg 100 mL/hr over 60 Minutes Intravenous Every 8 hours 07/21/18 1528 07/22/18 1701   07/21/18 1000  aztreonam (AZACTAM) 1 g in sodium chloride 0.9 % 100 mL IVPB  Status:  Discontinued     1 g 200 mL/hr over 30 Minutes Intravenous Every 8 hours 07/21/18 0908 07/21/18 1529   07/19/18 1600  vancomycin (VANCOCIN) IVPB  1000 mg/200 mL premix     1,000 mg 200 mL/hr over 60 Minutes Intravenous Every 6 hours 07/19/18 1359 07/23/18 0148   07/19/18 0900  aztreonam (AZACTAM) 1 g in sodium chloride 0.9 % 100 mL IVPB  Status:   Discontinued     1 g 200 mL/hr over 30 Minutes Intravenous Every 8 hours 07/19/18 0844 07/19/18 1348   07/18/18 0830  vancomycin (VANCOCIN) IVPB 1000 mg/200 mL premix  Status:  Discontinued     1,000 mg 200 mL/hr over 60 Minutes Intravenous Every 8 hours 07/18/18 0820 07/19/18 1359   07/17/18 1600  vancomycin (VANCOCIN) 1,250 mg in sodium chloride 0.9 % 250 mL IVPB  Status:  Discontinued     1,250 mg 166.7 mL/hr over 90 Minutes Intravenous Every 24 hours 07/16/18 1436 07/17/18 0815   07/17/18 0900  vancomycin (VANCOCIN) IVPB 1000 mg/200 mL premix  Status:  Discontinued     1,000 mg 200 mL/hr over 60 Minutes Intravenous Every 12 hours 07/17/18 0815 07/18/18 0820   07/16/18 2300  aztreonam (AZACTAM) 1 g in sodium chloride 0.9 % 100 mL IVPB  Status:  Discontinued     1 g 200 mL/hr over 30 Minutes Intravenous Every 8 hours 07/16/18 1436 07/18/18 1508   07/16/18 1445  vancomycin (VANCOCIN) 1,750 mg in sodium chloride 0.9 % 500 mL IVPB     1,750 mg 250 mL/hr over 120 Minutes Intravenous  Once 07/16/18 1436 07/16/18 1844   07/16/18 1430  vancomycin (VANCOCIN) IVPB 1000 mg/200 mL premix  Status:  Discontinued     1,000 mg 200 mL/hr over 60 Minutes Intravenous  Once 07/16/18 1429 07/16/18 1436   07/16/18 1430  aztreonam (AZACTAM) 2 g in sodium chloride 0.9 % 100 mL IVPB     2 g 200 mL/hr over 30 Minutes Intravenous  Once 07/16/18 1429 07/17/18 0028       Objective: Physical Exam: Vitals:   07/25/18 0852 07/25/18 1658 07/25/18 2300 07/26/18 0745  BP: 109/76 129/72 112/62 113/67  Pulse: 90 89 89 80  Resp: 18 18 18 17   Temp: 98.3 F (36.8 C) 98.4 F (36.9 C) 98.5 F (36.9 C)   TempSrc: Oral Oral Oral   SpO2: 97% 97% 98% 98%  Weight:      Height:        Intake/Output Summary (Last 24 hours) at 07/26/2018 1500 Last data filed at 07/26/2018 0900 Gross per 24 hour  Intake 240 ml  Output 250 ml  Net -10 ml   Filed Weights   07/17/18 0936  Weight: 90.7 kg   Gen: Awake, Alert,  Oriented X 3, no distress HEENT: PERRLA, Neck supple, no JVD Lungs: Good air movement bilaterally, CTAB CVS: RRR,No Gallops,Rubs or new Murmurs Abd: soft, Non tender, non distended, BS present Extremities: Foot and dressing Skin: No rashes  Data Reviewed: CBC: Recent Labs  Lab 07/21/18 0555 07/22/18 0408 07/23/18 0301 07/24/18 0332 07/26/18 0331  WBC 8.4 8.2 8.6 9.4 8.1  NEUTROABS  --  5.7  --   --  3.8  HGB 11.3* 12.0* 12.6* 13.2 13.0  HCT 34.8* 37.6* 39.5 40.5 40.0  MCV 88.8 88.7 89.0 88.2 88.9  PLT 134* 160 213 261 353   Basic Metabolic Panel: Recent Labs  Lab 07/20/18 0500 07/21/18 0555 07/22/18 0408 07/23/18 0301 07/24/18 0332 07/25/18 0402 07/26/18 0331  NA 140 140 139 141 138 137 140  K 3.4* 3.3* 3.9 3.8 3.9 3.9 3.7  CL 108 106 108 109 108 106 106  CO2 23 23 21* 20* 22 23 26   GLUCOSE 92 95 98 76 97 96 125*  BUN 10 12 13 15 16 20 16   CREATININE 0.70 0.74 0.84 0.80 0.88 0.97 0.92  CALCIUM 8.8* 9.1 9.3 9.4 9.6 9.7 9.5  MG 1.7 2.0 2.0  --   --   --   --   PHOS 4.6 4.6  --   --   --   --   --     Liver Function Tests: Recent Labs  Lab 07/22/18 0408 07/23/18 0301 07/24/18 0332 07/25/18 0402 07/26/18 0331  AST 113* 73* 57* 46* 36  ALT 1,197* 855* 642* 485* 305*  ALKPHOS 63 64 64 65 65  BILITOT 1.8* 1.5* 1.6* 1.6* 1.0  PROT 6.7 6.7 7.4 7.6 7.2  ALBUMIN 3.3* 3.5 3.8 3.8 3.5   No results for input(s): LIPASE, AMYLASE in the last 168 hours. No results for input(s): AMMONIA in the last 168 hours. Coagulation Profile: Recent Labs  Lab 07/22/18 0408  INR 1.11   Cardiac Enzymes: Recent Labs  Lab 07/22/18 0408  CKTOTAL 277   BNP (last 3 results) No results for input(s): PROBNP in the last 8760 hours. CBG: Recent Labs  Lab 07/19/18 2006 07/20/18 0022 07/20/18 0439 07/20/18 0844 07/20/18 1217  GLUCAP 78 79 83 83 102*   Studies: No results found.   Time spent: 25 minutes  Author: Zannie Cove, MD [facility] Page via [email or web address]  password [facility] 07/26/2018 3:00 PM  If 7PM-7AM, please contact night-coverage at [email or web address], password [facility]

## 2018-07-26 NOTE — Care Management Note (Signed)
Case Management Note  Patient Details  Name: Randy Mcintyre MRN: [id number] Date of Birth: 1996-04-13  Subjective/Objective:  From home, admitted 10/06 with unresponsiveness, acute renal failure, transaminitis. Pt was awaiting surgery for recent fall and L foot fracture. Pt found next to empty bottle of painkillers,  required intubation 10/06, extubated 10/09, with pna. [facility], per pt eval rec CIR, need CIR consult.   10/16 Randy Cape RN, BSN - [facility], [facility] pharmacy for medication ast Mon- Friday.  If dc over the weekend will need Match letter for medication ast.                    Action/Plan: NCM will follow for transition of care needs.  Expected Discharge Date:                  Expected Discharge Plan:  Home/Self Care  In-House Referral:  Clinical Social Work  Discharge planning Services  CM Consult, [facility], Follow-up appt scheduled, Medication Assistance  Post Acute Care Choice:    Choice offered to:     DME Arranged:    DME Agency:     HH Arranged:    HH Agency:     Status of Service:  Completed, signed off  If discussed at [employer], dates discussed:    Additional Comments:  Leone Haven, RN 07/26/2018, 11:58 AM

## 2018-07-26 NOTE — Progress Notes (Signed)
Subjective: Doing well this am.  Is ready for surgery for left foot today.  No SOB or CP.    Objective: Vital signs in last 24 hours: Temp:  [98.3 F (36.8 C)-98.5 F (36.9 C)] 98.5 F (36.9 C) (10/15 2300) Pulse Rate:  [80-90] 80 (10/16 0745) Resp:  [17-18] 17 (10/16 0745) BP: (109-129)/(62-76) 113/67 (10/16 0745) SpO2:  [97 %-98 %] 98 % (10/16 0745)   Recent Labs    07/24/18 0332 07/26/18 0331  HGB 13.2 13.0   Recent Labs    07/24/18 0332 07/26/18 0331  WBC 9.4 8.1  RBC 4.59 4.50  HCT 40.5 40.0  PLT 261 353   Awake and alert. LLE in splint.  Swelling to foot. SILT toes. Foot WWP. Wiggles toes.     Assessment/Plan: Will proceed with planned ORIF left foot.  Remain NPO  He will remain NWB LLE after surgery.  He understands the risks, benefits and alternatives to surgery.  The details of the surgery were discussed sith him previously and this am with fiance at bedside. Questions were answered.    Randy Mcintyre 07/26/2018, 8:29 AM

## 2018-07-26 NOTE — Anesthesia Preprocedure Evaluation (Addendum)
Anesthesia Evaluation  Patient identified by MRN, date of birth, ID band Patient awake    Reviewed: Allergy & Precautions, NPO status , Patient's Chart, lab work & pertinent test results  Airway Mallampati: II  TM Distance: >3 FB     Dental  (+) Teeth Intact, Dental Advisory Given   Pulmonary Current Smoker,    breath sounds clear to auscultation       Cardiovascular negative cardio ROS   Rhythm:Regular Rate:Normal     Neuro/Psych Anxiety    GI/Hepatic negative GI ROS, Neg liver ROS,   Endo/Other  negative endocrine ROS  Renal/GU Renal disease     Musculoskeletal negative musculoskeletal ROS (+)   Abdominal Normal abdominal exam  (+)   Peds  Hematology negative hematology ROS (+)   Anesthesia Other Findings   Reproductive/Obstetrics                           Lab Results  Component Value Date   WBC 8.1 07/26/2018   HGB 13.0 07/26/2018   HCT 40.0 07/26/2018   MCV 88.9 07/26/2018   PLT 353 07/26/2018   Lab Results  Component Value Date   CREATININE 0.92 07/26/2018   BUN 16 07/26/2018   NA 140 07/26/2018   K 3.7 07/26/2018   CL 106 07/26/2018   CO2 26 07/26/2018   Lab Results  Component Value Date   INR 1.11 07/22/2018   INR 1.42 07/19/2018   INR 1.76 07/18/2018   Echo: - Left ventricle: The cavity size was mildly dilated. Wall   thickness was normal. Systolic function was mildly reduced. The   estimated ejection fraction was in the range of 45% to 50%.   Diffuse hypokinesis. Features are consistent with a pseudonormal   left ventricular filling pattern, with concomitant abnormal   relaxation and increased filling pressure (grade 2 diastolic   dysfunction). - Aortic valve: There was no stenosis. - Mitral valve: Mildly calcified annulus. There was trivial   regurgitation. - Right ventricle: The cavity size was mildly dilated. Systolic   function was normal. - Right  atrium: The atrium was mildly dilated. - Pulmonary arteries: PA peak pressure: 35 mm Hg (S). - Systemic veins: IVC measured 2.2 cm with < 50% respirophasic   variation, suggesting RA pressure 15 mmHg.   Anesthesia Physical Anesthesia Plan  ASA: II  Anesthesia Plan: General   Post-op Pain Management: GA combined w/ Regional for post-op pain   Induction: Intravenous  PONV Risk Score and Plan: 2 and Ondansetron, Dexamethasone and Midazolam  Airway Management Planned: LMA  Additional Equipment: None  Intra-op Plan:   Post-operative Plan: Extubation in OR  Informed Consent: I have reviewed the patients History and Physical, chart, labs and discussed the procedure including the risks, benefits and alternatives for the proposed anesthesia with the patient or authorized representative who has indicated his/her understanding and acceptance.   Dental advisory given  Plan Discussed with: CRNA, Anesthesiologist and Surgeon  Anesthesia Plan Comments:        Anesthesia Quick Evaluation

## 2018-07-26 NOTE — Anesthesia Procedure Notes (Signed)
Anesthesia Regional Block: Adductor canal block   Pre-Anesthetic Checklist: ,, timeout performed, Correct Patient, Correct Site, Correct Laterality, Correct Procedure, Correct Position, site marked, Risks and benefits discussed,  Surgical consent,  Pre-op evaluation,  At surgeon's request and post-op pain management  Laterality: Left  Prep: chloraprep       Needles:  Injection technique: Single-shot  Needle Type: Echogenic Stimulator Needle     Needle Length: 9cm  Needle Gauge: 21     Additional Needles:   Procedures:,,,, ultrasound used (permanent image in chart),,,,  Narrative:  Start time: 07/26/2018 12:43 PM End time: 07/26/2018 12:48 PM Injection made incrementally with aspirations every 5 mL.  Performed by: Personally  Anesthesiologist: Shelton Silvas, MD  Additional Notes: Patient tolerated the procedure well. Local anesthetic introduced in an incremental fashion under minimal resistance after negative aspirations. No paresthesias were elicited. After completion of the procedure, no acute issues were identified and patient continued to be monitored by RN.

## 2018-07-27 LAB — COMPREHENSIVE METABOLIC PANEL
ALK PHOS: 61 U/L (ref 38–126)
ALT: 219 U/L — AB (ref 0–44)
AST: 35 U/L (ref 15–41)
Albumin: 3.4 g/dL — ABNORMAL LOW (ref 3.5–5.0)
Anion gap: 10 (ref 5–15)
BILIRUBIN TOTAL: 1 mg/dL (ref 0.3–1.2)
BUN: 13 mg/dL (ref 6–20)
CO2: 23 mmol/L (ref 22–32)
CREATININE: 0.87 mg/dL (ref 0.61–1.24)
Calcium: 9.1 mg/dL (ref 8.9–10.3)
Chloride: 103 mmol/L (ref 98–111)
Glucose, Bld: 132 mg/dL — ABNORMAL HIGH (ref 70–99)
Potassium: 3.8 mmol/L (ref 3.5–5.1)
Sodium: 136 mmol/L (ref 135–145)
Total Protein: 6.5 g/dL (ref 6.5–8.1)

## 2018-07-27 MED ORDER — OXYCODONE HCL 5 MG PO TABS: 5.0000 mg | ORAL_TABLET | ORAL | 0 refills | Status: AC | PRN

## 2018-07-27 MED ORDER — ASPIRIN 325 MG PO TABS: 325.0000 mg | ORAL_TABLET | Freq: Every day | ORAL | 0 refills | Status: DC

## 2018-07-27 MED ORDER — GABAPENTIN 600 MG PO TABS
300.0000 mg | ORAL_TABLET | Freq: Three times a day (TID) | ORAL | Status: DC
  Administered 2018-07-27: 300 mg via ORAL
  Filled 2018-07-27: qty 1

## 2018-07-27 MED ORDER — IBUPROFEN 200 MG PO TABS: 400.0000 mg | ORAL_TABLET | Freq: Three times a day (TID) | ORAL | 0 refills | Status: DC | PRN

## 2018-07-27 MED ORDER — MORPHINE SULFATE (PF) 2 MG/ML IV SOLN
2.0000 mg | Freq: Once | INTRAVENOUS | Status: AC
  Administered 2018-07-27: 2 mg via INTRAVENOUS
  Filled 2018-07-27: qty 1

## 2018-07-27 MED ORDER — OXYCODONE HCL 5 MG PO TABS
10.0000 mg | ORAL_TABLET | ORAL | Status: DC | PRN
  Administered 2018-07-27: 10 mg via ORAL
  Filled 2018-07-27: qty 2

## 2018-07-27 NOTE — Progress Notes (Addendum)
Inpatient Rehabilitation  Await updated PT and OT notes to determine post acute rehab needs.  Call if questions.   Update: Received notification from PT that patient doing well with gait and stairs and recommendations are now for home with home health.  Will sing off at this time.  Call if questions.   Charlane Ferretti., CCC/SLP Admission Coordinator  [facility] Inpatient Rehabilitation  Cell [phone]

## 2018-07-27 NOTE — Care Management Note (Signed)
Case Management Note Initial CM Note documented by Letha Cape RNCM Patient Details  Name: Randy Mcintyre MRN: [id number] Date of Birth: 11/06/1995  Subjective/Objective:  From home, admitted 10/06 with unresponsiveness, acute renal failure, transaminitis. Pt was awaiting surgery for recent fall and L foot fracture. Pt found next to empty bottle of painkillers,  required intubation 10/06, extubated 10/09, with pna. [facility], per pt eval rec CIR, need CIR consult.   10/16 Letha Cape RN, BSN - [facility], [facility] for medication ast Mon- Friday.  If dc over the weekend will need Match letter for medication ast.                    Action/Plan: NCM will follow for transition of care needs.  Expected Discharge Date:  07/27/18               Expected Discharge Plan:  Home/Self Care  In-House Referral:  Clinical Social Work  Discharge planning Services  CM Consult, [facility], Follow-up appt scheduled, Medication Assistance  Post Acute Care Choice:    Choice offered to:     DME Arranged:    DME Agency:     HH Arranged:    HH Agency:     Status of Service:  Completed, signed off  If discussed at [employer], dates discussed:    Additional Comments: 07/27/18 @ 1155-Rosemae Mcquown RNCM-CM spoke to Dr. Joya Martyr, patient will transition home with HHPT & RW with verbal order to arrange. CM consult and HH/DME orders entered. Referral given to Lupita Leash [facility] liaison, with patient to be screened for [facility]; patient will be contacted by [facility] post transition for arrangement of services. RW was provided. CM spoke to patient/father and informed patient to f/u at [facility] for [facility]; discussed hospital f/u appointment on 07/31/18 @ 1300 with patient verbalizing understanding. No further needs from CM.   Colleen Can RN, BSN, NCM-BC,  ACM-RN [phone] 07/27/2018, 11:55 AM

## 2018-07-27 NOTE — Progress Notes (Signed)
Subjective: 1 Day Post-Op Procedure(s) (LRB): OPEN REDUCTION INTERNAL FIXATION (ORIF) FOOT LISFRANC  and metatarsal FRACTUREs (Left)  Complains of severe pain in his left foot.  Getting oxycodone, meloxicam and gabapentin.  Got a dose of IV morphine overnight that didn't help much. Denies SOB.  Wanting to go home.   Objective: Vital signs in last 24 hours: Temp:  [97.7 F (36.5 C)-98.7 F (37.1 C)] 98.7 F (37.1 C) (10/17 0757) Pulse Rate:  [97-111] 97 (10/17 0757) Resp:  [15-26] 20 (10/16 2145) BP: (105-132)/(61-94) 132/78 (10/17 0757) SpO2:  [93 %-98 %] 98 % (10/17 0757)   Awake and alert. Family at bedside.  Splint to LLE. Toes WWP.  SILT toes.    Assessment/Plan: 1 Day Post-Op Procedure(s) (LRB): OPEN REDUCTION INTERNAL FIXATION (ORIF) FOOT LISFRANC  and metatarsal FRACTUREs (Left)  He is having pain which is expected after a this type of surgery and I discussed this with him.  We also discussed how  I am reluctant to prescribe high dose narcotics given recent polysubstance overdose. Will increase his oxycodone to 10mg  q 4hrs.  Will defer IV narcotics to medical team. Also prescribed gabapentin for pain. He cannot get NSAIDS due to recent kidney injury.  NWB LLE PT for mobilization He will take 325 mg aspirin on discharge for 1 month for DVT prophylaxis.  Home when cleared by PT and medical team.  Okay per orthopaedics.    Terance Hart 07/27/2018, 8:26 AM

## 2018-07-27 NOTE — Progress Notes (Signed)
Physical Therapy Treatment Patient Details Name: Randy Mcintyre MRN: [id number] DOB: 1996/03/29 Today's Date: 07/27/2018    History of Present Illness 22 y.o. male admitted 10/06 with unresponsiveness, acute renal failure, transaminitis. Pt was awaiting surgery for recent fall (from [place]) and L foot fracture.  Pt found next to empty bottle of painkillers, upon admission required intubation 10/06, extubated 10/09 without complications. now s/p L ankle ORIF 10/16     PT Comments    Pt is progressing well with therapy after his LLE surgery. Demonstrating good adherence to NWB status and independence with gait and stair training this visit. Family, patient, and myself agree he no longer needs CIR, and is safe to return home with HHPT and family support.   Discussed alternative methods for pain control and safety considerations at home. Pt and family in agreement with no further concerns, reports he is at baseline cognitively as well.    Follow Up Recommendations  Home health PT     Equipment Recommendations  Rolling walker with 5" wheels    Recommendations for Other Services Speech consult     Precautions / Restrictions Restrictions Weight Bearing Restrictions: Yes LLE Weight Bearing: Non weight bearing    Mobility  Bed Mobility Overal bed mobility: Needs Assistance Bed Mobility: Sit to Supine     Supine to sit: Supervision        Transfers Overall transfer level: Needs assistance Equipment used: Rolling walker (2 wheeled) Transfers: Sit to/from Stand Sit to Stand: Supervision            Ambulation/Gait Ambulation/Gait assistance: Supervision Gait Distance (Feet): 200 Feet Assistive device: Rolling walker (2 wheeled) Gait Pattern/deviations: Step-to pattern;Decreased stride length;Trunk flexed Gait velocity: decreased   General Gait Details: Patient S level with good stability on RW   Stairs Stairs: Yes Stairs assistance: Min guard Stair Management: No  rails Number of Stairs: 12 General stair comments: demonstrated various options for duel entrance into house, pt prefers scooting method demonstrates good safety and adherence to NWB status during session   Wheelchair Mobility    Modified Rankin (Stroke Patients Only)       Balance Overall balance assessment: Needs assistance Sitting-balance support: No upper extremity supported Sitting balance-Leahy Scale: Good       Standing balance-Leahy Scale: Poor Standing balance comment: relaint on one hand on RW for pulling up boxers                            Cognition Arousal/Alertness: Awake/alert Behavior During Therapy: Flat affect                                   General Comments: reports he is back to baseline      Exercises      General Comments General comments (skin integrity, edema, etc.): family present discussed alternative methods for pain control and safety considerations at home.       Pertinent Vitals/Pain Pain Assessment: Faces Faces Pain Scale: Hurts even more Pain Location: LLE Pain Descriptors / Indicators: Sore Pain Intervention(s): Limited activity within patient's tolerance;Monitored during session;Premedicated before session    Home Living                      Prior Function            PT Goals (current goals can now be found in the care  plan section) Acute Rehab PT Goals PT Goal Formulation: With patient Time For Goal Achievement: 08/03/18 Potential to Achieve Goals: Good Progress towards PT goals: Progressing toward goals    Frequency    Min 3X/week      PT Plan Discharge plan needs to be updated    Co-evaluation              AM-PAC PT "6 Clicks" Daily Activity  Outcome Measure  Difficulty turning over in bed (including adjusting bedclothes, sheets and blankets)?: None Difficulty moving from lying on back to sitting on the side of the bed? : None Difficulty sitting down on and  standing up from a chair with arms (e.g., wheelchair, bedside commode, etc,.)?: None Help needed moving to and from a bed to chair (including a wheelchair)?: None Help needed walking in hospital room?: A Little Help needed climbing 3-5 steps with a railing? : A Little 6 Click Score: 22    End of Session Equipment Utilized During Treatment: Gait belt Activity Tolerance: Patient tolerated treatment well Patient left: in bed;with call bell/phone within reach;with family/visitor present Nurse Communication: Mobility status PT Visit Diagnosis: Unsteadiness on feet (R26.81);Other abnormalities of gait and mobility (R26.89);Muscle weakness (generalized) (M62.81);Difficulty in walking, not elsewhere classified (R26.2);Pain Pain - Right/Left: Left Pain - part of body: Ankle and joints of foot     Time: 1610-9604 PT Time Calculation (min) (ACUTE ONLY): 25 min  Charges:  $Gait Training: 23-37 mins                    Etta Grandchild, PT, DPT Acute Rehabilitation Services Pager: [phone] Office: [phone]     Etta Grandchild 07/27/2018, 10:56 AM

## 2018-07-27 NOTE — Discharge Instructions (Signed)
DR. Susa Simmonds FOOT [facility] ANKLE SURGERY POST-OP INSTRUCTIONS   Pain Management 1. Keep your foot elevated above heart level.  Make sure that your heel hangs free ('floats'). 2. Take all prescribed medication as directed. 3. If taking narcotic pain medication you may want to use an over-the-counter stool softener to avoid constipation.   Activity  ? Non-weightbearing   First Postoperative Visit 1. Your first postop visit will be at least 2 weeks after surgery.  This should be scheduled when you schedule surgery. 2. If you do not have a postoperative visit scheduled please call [phone] to schedule an appointment. 3. At the appointment your incision will be evaluated for suture removal, x-rays will be obtained if necessary.  General Instructions 1. Swelling is very common after foot and ankle surgery.  It often takes 3 months for the foot and ankle to begin to feel comfortable.  Some amount of swelling will persist for 6-12 months. 2. DO NOT change the dressing.  If there is a problem with the dressing (too tight, loose, gets wet, etc.) please contact Dr. Donnie Mesa office. 3. DO NOT get the dressing wet.  For showers you can use an over-the-counter cast cover or wrap a washcloth around the top of your dressing and then cover it with a plastic bag and tape it to your leg. 4. DO NOT soak the incision (no tubs, pools, bath, etc.) until you have approval from Dr. Susa Simmonds.  Contact Dr. Garret Reddish office or go to Emergency Room if: 1. Temperature above 101 F. 2. Increasing pain that is unresponsive to pain medication or elevation 3. Excessive redness or swelling in your foot 4. Dressing problems - excessive bloody drainage, looseness or tightness, or if dressing gets wet 5. Develop pain, swelling, warmth, or discoloration of your calf

## 2018-07-28 ENCOUNTER — Encounter (HOSPITAL_COMMUNITY): Payer: Self-pay | Admitting: Orthopaedic Surgery

## 2018-07-31 ENCOUNTER — Ambulatory Visit (INDEPENDENT_AMBULATORY_CARE_PROVIDER_SITE_OTHER): Payer: Self-pay | Admitting: Family Medicine

## 2018-07-31 ENCOUNTER — Encounter: Payer: Self-pay | Admitting: Family Medicine

## 2018-07-31 VITALS — BP 119/69 | HR 94 | Temp 98.7°F | Resp 16

## 2018-07-31 DIAGNOSIS — Z114 Encounter for screening for human immunodeficiency virus [HIV]: Secondary | ICD-10-CM

## 2018-07-31 DIAGNOSIS — F1910 Other psychoactive substance abuse, uncomplicated: Secondary | ICD-10-CM

## 2018-07-31 DIAGNOSIS — J9601 Acute respiratory failure with hypoxia: Secondary | ICD-10-CM

## 2018-07-31 DIAGNOSIS — R740 Nonspecific elevation of levels of transaminase and lactic acid dehydrogenase [LDH]: Secondary | ICD-10-CM

## 2018-07-31 DIAGNOSIS — R7401 Elevation of levels of liver transaminase levels: Secondary | ICD-10-CM

## 2018-07-31 NOTE — Progress Notes (Signed)
[facility]  New Patient Encounter Provider: Mike Gip, FNP    ZOX:[id number]  [id number]:[id number]  DOB - [birth year]  SUBJECTIVE:   Randy Mcintyre, is a 22 y.o. male who presents to establish care with this clinic.   Current problems/concerns:   Patient states that on 07/08/2018, he fell out of a tree stand and fractured lower left extremity. States that he was given "a bunch of meds" for pain.   Patient states that his girlfriend found him and called 911.   Patient was inpatient from October 6 - October 17.  He was found to have acute toxic and metabolic encephalopathy due to drug overdose that was unintentional.  He also has acute kidney injury as well as liver dysfunction.  Patient was admitted to the ICU and intubated for airway protection.  He was found to have MRSA pneumonia and treated with vancomycin and Flagyl initially.  He was then changed to oral doxycycline.  Patient with a past history of substance abuse and alcohol use.Patient with positive urine drug screen.  Positive for benzodiazepines opiates, cocaine, and marijuana. Patient denies intentional overdose.  He presents with the girlfriend today who states that the pill bottle was empty due to moving it to their pillbox for p.o. management.  Patient declines the need for substance abuse rehabilitation at the present time.  Patient states that he will not go if referred.  His girlfriend is concerned today about ordering an echocardiogram that was recommended while hospitalized. Allergies  Allergen Reactions  . Penicillins Other (See Comments)    Severe mouth ulcers Has patient had a PCN reaction causing immediate rash, facial/tongue/throat swelling, SOB or lightheadedness with hypotension: No HAS PT DEVELOPED SEVERE RASH INVOLVING MUCUS MEMBRANES or SKIN NECROSIS:  YES SEVERE ORAL ULCERS. Has patient had a PCN reaction that required hospitalization: No Has patient had a PCN reaction  occurring within the last 10 years: Yes If all of the above answers are "NO", then may proceed with Cephalosporin use.   Past Medical History:  Diagnosis Date  . Anxiety   . Complication of anesthesia 07/08/2018   pation had a reducation and splitting in ED- "they said that it took 3 times the amount it should have."  . Medical history non-contributory    Current Outpatient Medications on File Prior to Visit  Medication Sig Dispense Refill  . aspirin 325 MG EC tablet Take 325 mg by mouth daily.    Marland Kitchen ibuprofen (ADVIL,MOTRIN) 200 MG tablet Take 2 tablets (400 mg total) by mouth every 8 (eight) hours as needed for headache or moderate pain (pain). 30 tablet 0  . gabapentin (NEURONTIN) 300 MG capsule Take 300 mg by mouth at bedtime.    . polyethylene glycol (MIRALAX / GLYCOLAX) packet Take 17 g by mouth daily.     No current facility-administered medications on file prior to visit.    History reviewed. No pertinent family history. Social History   Socioeconomic History  . Marital status: Single    Spouse name: Not on file  . Number of children: Not on file  . Years of education: Not on file  . Highest education level: Not on file  Occupational History  . Not on file  Social Needs  . Financial resource strain: Not on file  . Food insecurity:    Worry: Not on file    Inability: Not on file  . Transportation needs:    Medical: Not on file    Non-medical:  Not on file  Tobacco Use  . Smoking status: Current Some Day Smoker    Years: 7.00  . Smokeless tobacco: Current User    Types: Chew  . Tobacco comment: maybe 10 cigs a week,  Chew- 2 pouches a week   Substance and Sexual Activity  . Alcohol use: Yes    Alcohol/week: 24.0 standard drinks    Types: 24 Cans of beer per week  . Drug use: Yes    Types: Marijuana    Comment: last time 07/13/18  . Sexual activity: Not on file  Lifestyle  . Physical activity:    Days per week: Not on file    Minutes per session: Not on file    . Stress: Not on file  Relationships  . Social connections:    Talks on phone: Not on file    Gets together: Not on file    Attends religious service: Not on file    Active member of club or organization: Not on file    Attends meetings of clubs or organizations: Not on file    Relationship status: Not on file  . Intimate partner violence:    Fear of current or ex partner: Not on file    Emotionally abused: Not on file    Physically abused: Not on file    Forced sexual activity: Not on file  Other Topics Concern  . Not on file  Social History Narrative  . Not on file    Review of Systems  Constitutional: Negative.   HENT: Negative.   Eyes: Negative.   Respiratory: Negative.   Cardiovascular: Negative.   Gastrointestinal: Negative.   Genitourinary: Negative.   Musculoskeletal: Negative.   Skin: Negative.   Neurological: Negative.   Psychiatric/Behavioral: Negative.      OBJECTIVE:    BP 119/69 (BP Location: Right Arm, Patient Position: Sitting, Cuff Size: Normal)   Pulse 94   Temp 98.7 F (37.1 C) (Oral)   Resp 16   SpO2 100%   Physical Exam  Constitutional: He is oriented to person, place, and time and well-developed, well-nourished, and in no distress. No distress.  HENT:  Head: Normocephalic and atraumatic.  Eyes: Pupils are equal, round, and reactive to light. Conjunctivae and EOM are normal.  Neck: Normal range of motion. Neck supple.  Cardiovascular: Normal rate, regular rhythm and intact distal pulses. Exam reveals no gallop and no friction rub.  No murmur heard. Pulmonary/Chest: Effort normal and breath sounds normal. No respiratory distress. He has no wheezes.  Abdominal: Soft. Bowel sounds are normal. There is no tenderness.  Musculoskeletal: Normal range of motion. He exhibits no edema or tenderness.  Lymphadenopathy:    He has no cervical adenopathy.  Neurological: He is alert and oriented to person, place, and time.  Patient is seated in a  wheelchair.  He has a soft cast noted to the left lower extremity.  Skin: Skin is warm and dry.  Psychiatric: Memory, affect and judgment normal. He expresses no homicidal and no suicidal ideation. He expresses no suicidal plans and no homicidal plans.  Patient with a flat affect.  He denies depression or anxiety at the present time.  Nursing note and vitals reviewed.    ASSESSMENT/PLAN:  1. Acute respiratory failure with hypoxia [facility]) Patient advised of the severity of his illnesses through thorough chart review of hospital course.  We discussed the need for rehabilitation and the dangers of combining illegal substances and the overuse of alcohol.  Patient is not  interested in rehabilitation at the present time he states that he does not have a problem with drugs or alcohol.  Patient denies suicidal intent.  He does admit to using illegal substances.  He blames his medical illnesses on the medications that were given to him for his foot after the first emergency room visit.  We will continue to monitor his labs and continue with advice of rehabilitation. - ECHOCARDIOGRAM COMPLETE; Future - CBC with Differential - Comprehensive metabolic panel - Hepatitis panel, acute  2. Transaminitis We will continue to monitor  3. Screening for HIV (human immunodeficiency virus) Health maintenance - HIV antibody (with reflex)  30 minutes spent face-to-face with this patient during this encounter  Return in about 2 weeks (around 08/14/2018).  The patient was given clear instructions to go to ER or return to medical center if symptoms don't improve, worsen or new problems develop. The patient verbalized understanding. The patient was told to call to get lab results if they haven't heard anything in the next week.     This note has been created with Education officer, environmental. Any transcriptional errors are unintentional.   Ms. Andr L. Riley Lam, FNP-BC [facility] [address] [place], [place] [address] [phone]

## 2018-07-31 NOTE — Patient Instructions (Signed)
I ordered the ECHO for further evaluation of your heart. Someone will call you to schedule that appointment .     Hepatic Encephalopathy Hepatic encephalopathy is a loss of brain function from advanced liver disease. The effects of the condition depend on the type of liver damage and how severe it is. In some cases, hepatic encephalopathy can be reversed. What are the causes? The exact cause of hepatic encephalopathy is not known. What increases the risk? You have a higher risk of getting this condition if your liver is damaged. When the liver is damaged harmful substances called toxins can build up in the body. Certain toxins, such as ammonia, can harm your brain. Conditions that can cause liver damage include:  An infection.  Dehydration.  Intestinal bleeding.  Drinking too much alcohol.  Taking certain medicines, including tranquilizers, water pills (diuretics), antidepressants, or sleeping pills.  What are the signs or symptoms? Signs and symptoms may develop suddenly. Or, they may develop slowly and get worse gradually. Symptoms can range from mild to severe. Mild Hepatic Encephalopathy  Mild confusion.  Personality and mood changes.  Anxiety and agitation.  Drowsiness.  Loss of mental abilities.  Musty or sweet-smelling breath. Worsening or Severe Hepatic Encephalopathy  Slowed movement.  Slurred speech.  Extreme personality changes.  Disorientation.  Abnormal shaking or flapping of the hands.  Coma. How is this diagnosed? To make a diagnosis, your health care provider will do a physical exam. To rule out other causes of your signs and symptoms, he or she may order tests. You may have:  Blood tests. These may be done to check your ammonia level, measure how long it takes your blood to clot, and check for infection.  Liver function tests. These may be done to check how well your liver is working.  MRI and CT scans. These may be done to check for a brain  disorder.  Electroencephalogram (EEG). This may be done to measure the electrical activity in your brain.  How is this treated? The first step in treatment is identifying and treating possible triggers. The next step is involves taking medicine to lower the level of toxins in the body and to prevent ammonia from building up. You may need to take:  Antibiotics to reduce the ammonia-producing bacteria in your gut.  Lactulose to help flush ammonia from the gut.  Follow these instructions at home: Eating and drinking  Follow a low-protein diet that includes plenty of fruits, vegetables, and whole grains, as directed by your health care provider. Ammonia is produced when you digest high-protein foods.  Work with a Data processing manager or with your health care provider to make sure you are getting the right balance of protein and minerals.  Drink enough fluids to keep your urine clear or pale yellow. Drinking plenty of water helps prevent constipation.  Do not drink alcohol or use illegal drugs. Medicines  Only take medicine as directed by your health care provider.  If you were prescribed an antibiotic medicine, finish it all even if you start to feel better.  Do not start any new medicines, including over-the-counter medicines, without first checking with your health care provider. Contact a health care provider if:  You have new symptoms.  Your symptoms change.  Your symptoms get worse.  You have a fever.  You are constipated.  You have persistent nausea, vomiting, or diarrhea. Get help right away if:  You become very confused or drowsy.  You vomit blood or material that looks like  coffee grounds.  Your stool is bloody or black or looks like tar. This information is not intended to replace advice given to you by your health care provider. Make sure you discuss any questions you have with your health care provider. Document Released: 12/07/2006 Document Revised: 03/04/2016 Document  Reviewed: 05/15/2014 Elsevier Interactive Patient Education  2018 [employer].

## 2018-08-01 LAB — CBC WITH DIFFERENTIAL/PLATELET
Basophils Absolute: 0.1 10*3/uL (ref 0.0–0.2)
Basos: 1 %
EOS (ABSOLUTE): 0 10*3/uL (ref 0.0–0.4)
Eos: 0 %
Hematocrit: 41.4 % (ref 37.5–51.0)
Hemoglobin: 13.9 g/dL (ref 13.0–17.7)
Immature Grans (Abs): 0 10*3/uL (ref 0.0–0.1)
Immature Granulocytes: 0 %
Lymphocytes Absolute: 1.6 10*3/uL (ref 0.7–3.1)
Lymphs: 17 %
MCH: 29.4 pg (ref 26.6–33.0)
MCHC: 33.6 g/dL (ref 31.5–35.7)
MCV: 88 fL (ref 79–97)
Monocytes Absolute: 0.4 10*3/uL (ref 0.1–0.9)
Monocytes: 4 %
Neutrophils Absolute: 7.4 10*3/uL — ABNORMAL HIGH (ref 1.4–7.0)
Neutrophils: 78 %
Platelets: 522 10*3/uL — ABNORMAL HIGH (ref 150–450)
RBC: 4.72 x10E6/uL (ref 4.14–5.80)
RDW: 12.4 % (ref 12.3–15.4)
WBC: 9.5 10*3/uL (ref 3.4–10.8)

## 2018-08-01 LAB — HEPATITIS PANEL, ACUTE
Hep A IgM: NEGATIVE
Hep B C IgM: NEGATIVE
Hep C Virus Ab: <0.1 s/co ratio (ref 0.0–0.9)
Hepatitis B Surface Ag: NEGATIVE

## 2018-08-01 LAB — COMPREHENSIVE METABOLIC PANEL
ALT: 96 IU/L — ABNORMAL HIGH (ref 0–44)
AST: 31 IU/L (ref 0–40)
Albumin/Globulin Ratio: 1.4 (ref 1.2–2.2)
Albumin: 4.6 g/dL (ref 3.5–5.5)
Alkaline Phosphatase: 88 IU/L (ref 39–117)
BUN/Creatinine Ratio: 19 (ref 9–20)
BUN: 15 mg/dL (ref 6–20)
Bilirubin Total: 0.8 mg/dL (ref 0.0–1.2)
CO2: 23 mmol/L (ref 20–29)
Calcium: 10.5 mg/dL — ABNORMAL HIGH (ref 8.7–10.2)
Chloride: 102 mmol/L (ref 96–106)
Creatinine, Ser: 0.8 mg/dL (ref 0.76–1.27)
GFR calc Af Amer: 146 mL/min/{1.73_m2} (ref >59)
GFR calc non Af Amer: 126 mL/min/{1.73_m2} (ref >59)
Globulin, Total: 3.3 g/dL (ref 1.5–4.5)
Glucose: 112 mg/dL — ABNORMAL HIGH (ref 65–99)
Potassium: 4.5 mmol/L (ref 3.5–5.2)
Sodium: 141 mmol/L (ref 134–144)
Total Protein: 7.9 g/dL (ref 6.0–8.5)

## 2018-08-01 LAB — HIV ANTIBODY (ROUTINE TESTING W REFLEX): HIV Screen 4th Generation wRfx: NONREACTIVE

## 2018-08-01 NOTE — Discharge Summary (Signed)
Physician Discharge Summary  Randy Mcintyre:[id number] DOB: 04-24-2000 DOA: 07/16/2018  PCP: Randy Gip, FNP  Admit date: 07/16/2018 Discharge date: 07/27/2018  Time spent: 45 minutes  Recommendations for Outpatient Follow-up:  1. [facility] 10[facility]21 2. Needs repeat ECHO in 6weeks, and Cardiology FU if EF not improved   Discharge Diagnoses:    Acute Hypoxic resp failure   Acute toxic and metabolic encephalopathy   Acute kidney injury   Shock liver   left lower extremity trauma and fracture   Acute renal failure (HCC)   Acute respiratory failure with hypoxia (HCC)   Transaminitis   Cardiomyopathy   Thrombocytopenia   Elevated troponin   Discharge Condition: stable  Diet recommendation: heart healthy  Filed Weights   07/17/18 0936  Weight: 90.7 kg    History of present illness:  Pt. with PMH of substance abuse, alcohol use; admitted on 07/16/2018, presented with complaint of unresponsive event, was found to have acute toxic and metabolic encephalopathy drug overdose, unintentional, acute kidney injury as well as liver dysfunction.  Patient was admitted to the ICU, intubated for airway protection  Hospital Course:   1.  Acute hypoxic respiratory failure/due to overdose, toxic encephalopathy -Required ventilator dependent respiratory failure on admission -Complicated by MRSA pneumonia -Now improved, extubated, tracheal aspirate grew MRSA and 1 out of 2 blood cultures grew gram-positive rods which was felt to be a contaminant -Treated with vancomycin and Flagyl -Antibiotics subsequently transitioned by Dr. Allena Katz to oral doxycycline to complete 7 to 10-day course -Improved and stable from the standpoint  2.  Acute metabolic and toxic encephalopathy. Presented with unresponsiveness with an empty pill bottle -Needed intubation for airway protection. UDS is positive for positive for benzodiazepine, opiates, cocaine, marijuana. -Counseled  3.   Acute kidney injury. -Resolved with hydration  4.  Shock liver -LFTs improving, trending down  5.  Recent left lower extremity trauma and fracture. -seen by Ortho outpatient and was scheduled to go for surgery on 07/17/2018. -OR today -Home with Ortho follow up  6.  Elevated troponin with ST segment changes. -Seen on admission to ICU, Troponin peaked at 8.50. -EKG showed T wave inversions in anterior leads as well as inferior leads. -Echocardiogram shows 44 to 50% EF with Diffuse hypokinesis. -likely due to shock on admission, multisystem failure -Follow-up with PCP, repeat echocardiogram in 6 weeks  7.  Hypokalemia Hypomagnesemia replaced.  8.  Thrombocytopenia. Acute, platelet dropped to 53. Currently normal.  Consultants: primary admission with PCCM, CIR, orthopedic Procedures:   Echocardiogram  Impressions: - Mildly dilated LV with EF 45-50%, diffuse hypokinesis. Mildly   dilated RV with normal systolic function. Borderline pulmonary   hypertension. No significant valvular abnormalities.  EEG  Procedure(s) and Anesthesia Type: Open treatment of Lisfranc fracture dislocation Open treatment of intercuneiform disruption Open treatment with internal fixation of first TMT joint Open treatment of the second TMT joint Open treatment of fourth TMT joint with internal fixation Open treatment of 2, 3, 4, 5 metatarsal neck fractures Closed treatment of cuboid fracture Closed treatment of third metatarsal base fracture Closed treatment of lateral cuneiform fracture  Discharge Exam: Vitals:   07/26/18 2145 07/27/18 0757  BP: 122/61 132/78  Pulse: (!) 110 97  Resp: 20   Temp: 98.7 F (37.1 C) 98.7 F (37.1 C)  SpO2: 97% 98%    General: AAOx3 Cardiovascular: S1S2/RRR Respiratory: CTAB  Discharge Instructions   Discharge Instructions    Diet general   Complete by:  As directed  Increase activity slowly   Complete by:  As directed      Allergies as  of 07/27/2018      Reactions   Penicillins Other (See Comments)   Severe mouth ulcers Has patient had a PCN reaction causing immediate rash, facial/tongue/throat swelling, SOB or lightheadedness with hypotension: No HAS PT DEVELOPED SEVERE RASH INVOLVING MUCUS MEMBRANES or SKIN NECROSIS:  YES SEVERE ORAL ULCERS. Has patient had a PCN reaction that required hospitalization: No Has patient had a PCN reaction occurring within the last 10 years: Yes If all of the above answers are "NO", then may proceed with Cephalosporin use.      Medication List    STOP taking these medications   HYDROcodone-acetaminophen 5-325 MG tablet Commonly known as:  NORCO/VICODIN   meloxicam 15 MG tablet Commonly known as:  MOBIC     TAKE these medications   gabapentin 300 MG capsule Commonly known as:  NEURONTIN Take 300 mg by mouth at bedtime.   ibuprofen 200 MG tablet Commonly known as:  ADVIL,MOTRIN Take 2 tablets (400 mg total) by mouth every 8 (eight) hours as needed for headache or moderate pain (pain). What changed:    how much to take  when to take this  reasons to take this   oxyCODONE 5 MG immediate release tablet Commonly known as:  Oxy IR/ROXICODONE Take 1-2 tablets (5-10 mg total) by mouth every 4 (four) hours as needed for up to 5 days for severe pain.   polyethylene glycol packet Commonly known as:  MIRALAX / GLYCOLAX Take 17 g by mouth daily.      Allergies  Allergen Reactions  . Penicillins Other (See Comments)    Severe mouth ulcers Has patient had a PCN reaction causing immediate rash, facial/tongue/throat swelling, SOB or lightheadedness with hypotension: No HAS PT DEVELOPED SEVERE RASH INVOLVING MUCUS MEMBRANES or SKIN NECROSIS:  YES SEVERE ORAL ULCERS. Has patient had a PCN reaction that required hospitalization: No Has patient had a PCN reaction occurring within the last 10 years: Yes If all of the above answers are "NO", then may proceed with Cephalosporin use.    Follow-up Information    [facility] Follow up on 07/31/2018.   Specialty:  Internal Medicine Why:  1:00 pm for hospital follow up and establish pcp Contact information: [address] [id number] [place] [address] [phone]       [facility] Follow up.   Why:  you can use the pharmacy here for medicaiton ast Contact information: [address] [place] [phone]       Terance Hart, MD. Schedule an appointment as soon as possible for a visit in 2 weeks.   Specialty:  Orthopedic Surgery Contact information: [address] [place] [address] [phone]        [facility] - Dme Follow up.   Why:  Rolling walker Contact information: [address] [place] [address] [phone]        [facility] Follow up.   Specialty:  Home Health Services Why:  Home Health Physical Therapy Contact information: [address] [place] [address] [phone]            The results of significant diagnostics from this hospitalization (including imaging, microbiology, ancillary and laboratory) are listed below for reference.    Significant Diagnostic Studies: Dg Tibia/fibula Left  Result Date: 07/08/2018 CLINICAL DATA:  Pain  after fall EXAM: LEFT TIBIA AND FIBULA - 2 VIEW COMPARISON:  None. FINDINGS: There is no evidence of fracture or other focal bone lesions. Soft tissues are unremarkable. IMPRESSION: Negative. Electronically Signed   By: Gerome Sam III M.D   On: 07/08/2018 18:38   Dg Ankle Complete Left  Result Date: 07/08/2018 CLINICAL DATA:  Pain after fall EXAM: LEFT ANKLE COMPLETE - 3+ VIEW COMPARISON:  None. FINDINGS: There is no evidence of fracture, dislocation, or joint effusion. There is no evidence of arthropathy or other focal bone abnormality. Soft tissues are unremarkable. IMPRESSION:  Negative. Electronically Signed   By: Gerome Sam III M.D   On: 07/08/2018 18:40   Ct Head Wo Contrast  Result Date: 07/16/2018 CLINICAL DATA:  nasal 2mg  of narcan after finding empty hydrocodone bottle.not sure how many were left. Scheduled for surgery tomorrow to fix fracture. On arrival to ED unresponsive with no gag and hypotensive. EXAM: CT HEAD WITHOUT CONTRAST TECHNIQUE: Contiguous axial images were obtained from the base of the skull through the vertex without intravenous contrast. COMPARISON:  None. FINDINGS: Brain: No evidence of acute infarction, hemorrhage, hydrocephalus, extra-axial collection or mass lesion/mass effect. Motion artifact on scans through the skull base. Vascular: No hyperdense vessel or unexpected calcification. Skull: Normal. Negative for fracture or focal lesion. Sinuses/Orbits: No acute finding. Other: None IMPRESSION: Negative Electronically Signed   By: Corlis Leak M.D.   On: 07/16/2018 14:42   Ct Head Wo Contrast  Result Date: 07/08/2018 CLINICAL DATA:  Fall from tree stand EXAM: CT HEAD WITHOUT CONTRAST CT CERVICAL SPINE WITHOUT CONTRAST TECHNIQUE: Multidetector CT imaging of the head and cervical spine was performed following the standard protocol without intravenous contrast. Multiplanar CT image reconstructions of the cervical spine were also generated. COMPARISON:  None. FINDINGS: CT HEAD FINDINGS Brain: There is no mass, hemorrhage or extra-axial collection. The size and configuration of the ventricles and extra-axial CSF spaces are normal. There is no acute or chronic infarction. The brain parenchyma is normal. Vascular: No abnormal hyperdensity of the major intracranial arteries or dural venous sinuses. No intracranial atherosclerosis. Skull: The visualized skull base, calvarium and extracranial soft tissues are normal. Sinuses/Orbits: No fluid levels or advanced mucosal thickening of the visualized paranasal sinuses. No mastoid or middle ear effusion. The orbits  are normal. CT CERVICAL SPINE FINDINGS Alignment: No static subluxation. Facets are aligned. Occipital condyles are normally positioned. Skull base and vertebrae: No acute fracture. Soft tissues and spinal canal: No prevertebral fluid or swelling. No visible canal hematoma. Disc levels: No advanced spinal canal or neural foraminal stenosis. Upper chest: No pneumothorax, pulmonary nodule or pleural effusion. Other: Normal visualized paraspinal cervical soft tissues. IMPRESSION: Normal CT of the head and cervical spine. Electronically Signed   By: Deatra Robinson M.D.   On: 07/08/2018 19:13   Ct Angio Chest Pe W And/or Wo Contrast  Result Date: 07/16/2018 CLINICAL DATA:  Unresponsive. Clinical suspicion for pulmonary embolus. EXAM: CT ANGIOGRAPHY CHEST WITH CONTRAST TECHNIQUE: Multidetector CT imaging of the chest was performed using the standard protocol during bolus administration of intravenous contrast. Multiplanar CT image reconstructions and MIPs were obtained to evaluate the vascular anatomy. CONTRAST:  ISOVUE-370 IOPAMIDOL (ISOVUE-370) INJECTION 76% COMPARISON:  Heart size upper normal to mildly increased. No pericardial effusion. No thoracic aortic aneurysm. FINDINGS: Cardiovascular: No filling defects within the opacified pulmonary arteries to suggest the presence of an acute pulmonary embolus. Distal subsegmental arteries are obscured by breathing motion artifacts, hindering assessment. Mediastinum/Nodes: No mediastinal  lymphadenopathy. There is no hilar lymphadenopathy. NG tube is seen in the esophagus. Endotracheal tube tip is in the distal carina. There is no axillary lymphadenopathy. Lungs/Pleura: Bibasilar dependent collapse/consolidation noted left, left greater than right. No pulmonary edema or pleural effusion. No pneumothorax. Upper Abdomen: Liver is enlarged with diffuse fatty deposition within the parenchyma. Musculoskeletal: No worrisome lytic or sclerotic osseous abnormality. Review of  the MIP images confirms the above findings. IMPRESSION: 1. No CT evidence for acute pulmonary embolus. Distal subsegmental pulmonary arteries may not be reliably evaluated due to motion artifact. 2. Dependent collapse/consolidation in both lower lobes, left greater than right. 3. Hepatomegaly with hepatic steatosis. Electronically Signed   By: Kennith Center M.D.   On: 07/16/2018 14:45   Ct Cervical Spine Wo Contrast  Result Date: 07/08/2018 CLINICAL DATA:  Fall from tree stand EXAM: CT HEAD WITHOUT CONTRAST CT CERVICAL SPINE WITHOUT CONTRAST TECHNIQUE: Multidetector CT imaging of the head and cervical spine was performed following the standard protocol without intravenous contrast. Multiplanar CT image reconstructions of the cervical spine were also generated. COMPARISON:  None. FINDINGS: CT HEAD FINDINGS Brain: There is no mass, hemorrhage or extra-axial collection. The size and configuration of the ventricles and extra-axial CSF spaces are normal. There is no acute or chronic infarction. The brain parenchyma is normal. Vascular: No abnormal hyperdensity of the major intracranial arteries or dural venous sinuses. No intracranial atherosclerosis. Skull: The visualized skull base, calvarium and extracranial soft tissues are normal. Sinuses/Orbits: No fluid levels or advanced mucosal thickening of the visualized paranasal sinuses. No mastoid or middle ear effusion. The orbits are normal. CT CERVICAL SPINE FINDINGS Alignment: No static subluxation. Facets are aligned. Occipital condyles are normally positioned. Skull base and vertebrae: No acute fracture. Soft tissues and spinal canal: No prevertebral fluid or swelling. No visible canal hematoma. Disc levels: No advanced spinal canal or neural foraminal stenosis. Upper chest: No pneumothorax, pulmonary nodule or pleural effusion. Other: Normal visualized paraspinal cervical soft tissues. IMPRESSION: Normal CT of the head and cervical spine. Electronically Signed    By: Deatra Robinson M.D.   On: 07/08/2018 19:13   US Renal  Result Date: 07/16/2018 CLINICAL DATA:  Acute renal failure. EXAM: RENAL / URINARY TRACT ULTRASOUND COMPLETE COMPARISON:  None. FINDINGS: Right Kidney: Length: 10.2 cm. Echogenicity within normal limits. No mass or hydronephrosis visualized. Left Kidney: Length: 11.3 cm. Echogenicity within normal limits. No mass or hydronephrosis visualized. Bladder: Nonvisualized/decompressed. IMPRESSION: No evidence for hydronephrosis. Renal parenchyma is not markedly echogenic. Electronically Signed   By: Kennith Center M.D.   On: 07/16/2018 17:20   Dg Pelvis Portable  Result Date: 07/08/2018 CLINICAL DATA:  Fall. EXAM: PORTABLE PELVIS 1-2 VIEWS COMPARISON:  None. FINDINGS: No definite evidence of fracture is noted. However, mild widening of the left sacroiliac joint is noted. Hip joints are unremarkable. IMPRESSION: Mild widening of left sacroiliac joint is noted. CT scan of the pelvis is recommended to evaluate for possible occult fracture and other abnormality. Electronically Signed   By: Lupita Raider, M.D.   On: 07/08/2018 17:51   Ct Foot Left Wo Contrast  Result Date: 07/09/2018 CLINICAL DATA:  Status post fall from 30 feet. EXAM: CT OF THE LEFT FOOT WITHOUT CONTRAST TECHNIQUE: Multidetector CT imaging of the left foot was performed according to the standard protocol. Multiplanar CT image reconstructions were also generated. COMPARISON:  None. FINDINGS: Bones/Joint/Cartilage Nondisplaced fracture at the base of the first metatarsal involving the plantar aspect involving the articular surface. Tiny bony  fragment involving the distal plantar medial aspect of medial cuneiform concerning for a tiny fracture. Comminuted fracture involving the plantar medial aspect of the middle cuneiform. Comminuted fracture without significant displacement involving the plantar aspect of the lateral cuneiform. Comminuted fracture of the lateral aspect of the cuboid  involving the articular surface at the TMT joint. Displaced fracture of the second metatarsal neck with 6 mm of lateral displacement and 10 mm of dorsal displacement. Comminuted, displaced fracture of the distal shaft of the third metatarsal with 10 mm of lateral displacement. Comminuted fracture of the fourth metatarsal neck with 2 cm of dorsal displacement, 10 mm of medial displacement and 16 mm of overriding between the fracture fragments. Mildly comminuted fracture of fifth metatarsal neck with 12 mm of lateral displacement and 8 mm of dorsal displacement. Mildly comminuted fracture at the base of fourth metatarsal without significant displacement involving the articular surface. Lisfranc joint is congruent. Ankle mortise is normal. No aggressive osseous lesion. No significant joint effusion. Ligaments Ligaments are suboptimally evaluated by CT. Visualized anterior and posterior talofibular ligaments are intact. Muscles and Tendons Muscles are normal. No muscle atrophy. Flexor, extensor, peroneal and Achilles tendons are grossly intact. Soft tissue No fluid collection or hematoma. No soft tissue mass. Generalized soft tissue edema along the dorsal aspect of midfoot and forefoot. IMPRESSION: 1. Nondisplaced fracture at the base of the first metatarsal involving the plantar aspect involving the articular surface. 2. Tiny bony fragment involving the distal plantar medial aspect of medial cuneiform concerning for a tiny fracture. 3. Comminuted fracture involving the plantar medial aspect of the middle cuneiform. 4. Comminuted fracture without significant displacement involving the plantar aspect of the lateral cuneiform. 5. Comminuted fracture of the lateral aspect of the cuboid involving the articular surface at the TMT joint. 6. Displaced fracture of the second metatarsal neck with 6 mm of lateral displacement and 10 mm of dorsal displacement. 7. Comminuted, displaced fracture of the distal shaft of the third  metatarsal with 10 mm of lateral displacement. 8. Comminuted fracture of the fourth metatarsal neck with 2 cm of dorsal displacement, 10 mm of medial displacement and 16 mm of overriding between the fracture fragments. 9. Mildly comminuted fracture of fifth metatarsal neck with 12 mm of lateral displacement and 8 mm of dorsal displacement. 10. Mildly comminuted fracture at the base of fourth metatarsal without significant displacement involving the articular surface. 11. Lisfranc joint is congruent on this nonweightbearing exam. If there is further clinical concern regarding injury of the Lisfranc joint, recommend a weight-bearing view when the patient is able. Electronically Signed   By: Elige Ko   On: 07/09/2018 09:05   Dg Chest Port 1 View  Result Date: 07/20/2018 CLINICAL DATA:  Respiratory failure, smoker EXAM: PORTABLE CHEST 1 VIEW COMPARISON:  Portable exam 0519 hours compared to 07/19/2018 FINDINGS: Interval removal of nasogastric and endotracheal tubes. Tip of LEFT jugular central venous catheter projects over SVC at the level of the azygos arch. Enlargement of cardiac silhouette. Prominent mediastinum likely accentuated by technique. Decreased lung volumes with bibasilar atelectasis. No gross pleural effusion or pneumothorax. IMPRESSION: Enlargement of cardiac silhouette with low lung volumes and bibasilar atelectasis. Electronically Signed   By: Ulyses Southward M.D.   On: 07/20/2018 10:45   Dg Chest Port 1 View  Result Date: 07/19/2018 CLINICAL DATA:  Respiratory failure EXAM: PORTABLE CHEST 1 VIEW COMPARISON:  Portable exam 0530 hours compared to 07/17/2018 FINDINGS: Tip of endotracheal tube projects 3.6 cm above carina. Nasogastric  tube extends into stomach. LEFT jugular central venous catheter with tip projecting over SVC. Rotated to the RIGHT. Enlargement of cardiac silhouette. Mediastinal contours normal. Bibasilar atelectasis. Upper lungs clear. No gross pleural effusion or pneumothorax.  IMPRESSION: Low lung volumes with bibasilar atelectasis. Enlargement of cardiac silhouette. Electronically Signed   By: Ulyses Southward M.D.   On: 07/19/2018 11:22   Dg Chest Port 1 View  Result Date: 07/17/2018 CLINICAL DATA:  Endotracheal tube, shortness of breath. EXAM: PORTABLE CHEST 1 VIEW COMPARISON:  07/16/2018 and CT chest 07/16/2018. FINDINGS: Endotracheal tube terminates 3.7 cm above the carina. Nasogastric tube is followed into the stomach. Left IJ central line tip projects over the SVC. Heart is enlarged, stable. Bibasilar airspace opacification, left greater than right, similar to minimally worsened with decreased lung volumes. There may be small bilateral pleural effusions. IMPRESSION: 1. Bibasilar airspace opacification is likely due to atelectasis. Difficult to exclude aspiration or pneumonia. 2. Possible small bilateral pleural effusions. Electronically Signed   By: Leanna Battles M.D.   On: 07/17/2018 07:01   Dg Chest Port 1 View  Result Date: 07/16/2018 CLINICAL DATA:  Central line placement EXAM: PORTABLE CHEST 1 VIEW COMPARISON:  Chest radiograph from earlier today. FINDINGS: Endotracheal tube tip is 2.8 cm above the carina. Enteric tube enters stomach with the tip not seen on this image. Left internal jugular central venous catheter crosses the midline and terminates in region of the right subclavian vein. Stable cardiomediastinal silhouette with mild cardiomegaly. No pneumothorax. No pleural effusion. Bibasilar lung opacities, unchanged, favor atelectasis. No overt pulmonary edema. Low lung volumes. IMPRESSION: 1. Left internal jugular central venous catheter crosses the midline and terminates in the region of the right subclavian vein. No pneumothorax. 2. Well-positioned endotracheal and enteric tubes. 3. Stable low lung volumes and bibasilar lung opacities favoring atelectasis. 4. Stable mild cardiomegaly without overt pulmonary edema. These results were called by telephone at the time  of interpretation on 07/16/2018 at 3:41 pm to Dr. Jacqulyn Bath in the ED, who verbally acknowledged these results. Electronically Signed   By: Delbert Phenix M.D.   On: 07/16/2018 15:45   Dg Chest Portable 1 View  Result Date: 07/16/2018 CLINICAL DATA:  Post intubation EXAM: PORTABLE CHEST 1 VIEW COMPARISON:  Portable exam 1332 hours compared to 928 1,019 FINDINGS: Tip of endotracheal tube projects 3.6 cm above carina. Nasogastric tube extends into stomach. Enlargement of cardiac silhouette. Prominent mediastinum likely accentuated by portable supine technique. Bibasilar atelectasis. Questionable LEFT perihilar infiltrate. No pleural effusion or pneumothorax. IMPRESSION: Tip of endotracheal tube projects 3.6 cm above carina. Bibasilar atelectasis and questionable LEFT perihilar infiltrate. Enlargement of cardiac silhouette. Electronically Signed   By: Ulyses Southward M.D.   On: 07/16/2018 13:58   Dg Chest Port 1 View  Result Date: 07/08/2018 CLINICAL DATA:  Fall. EXAM: PORTABLE CHEST 1 VIEW COMPARISON:  None. FINDINGS: The heart size and mediastinal contours are within normal limits. Both lungs are clear. No pneumothorax or pleural effusion is noted. The visualized skeletal structures are unremarkable. IMPRESSION: No acute cardiopulmonary abnormality seen. Electronically Signed   By: Lupita Raider, M.D.   On: 07/08/2018 17:49   Dg Shoulder Left  Result Date: 07/08/2018 CLINICAL DATA:  Pain after fall EXAM: LEFT SHOULDER - 2+ VIEW COMPARISON:  None. FINDINGS: There is no evidence of fracture or dislocation. There is no evidence of arthropathy or other focal bone abnormality. Soft tissues are unremarkable. IMPRESSION: Negative. Electronically Signed   By: Gerome Sam III M.D  On: 07/08/2018 18:45   Dg Foot Complete Left  Result Date: 07/26/2018 CLINICAL DATA:  Open reduction internal fixation of left foot fractures. EXAM: DG C-ARM 61-120 MIN; LEFT FOOT - COMPLETE 3+ VIEW FLUOROSCOPY TIME:  35 seconds.  COMPARISON:  Radiographs of July 08, 2018. FINDINGS: Three intraoperative fluoroscopic images of the left foot demonstrate surgical pinning of fractures involving the distal second, third, fourth and fifth metatarsals. Surgical fusion of the first and fourth tarsometatarsal joints is noted, as well as the cuneiform bones. IMPRESSION: Surgical internal fixation of multiple fractures as described above. Electronically Signed   By: Lupita Raider, M.D.   On: 07/26/2018 15:58   Dg Foot Complete Left  Result Date: 07/08/2018 CLINICAL DATA:  22 year old male with reduction of the left foot fractures. EXAM: LEFT FOOT - COMPLETE 3+ VIEW COMPARISON:  Earlier CT dated 07/08/2018 FINDINGS: Mildly displaced fractures of the distal second-fifth metatarsals. Overall decreased displacement of the fractures since the prior CT. There is persistent plantar displacement of a metatarsal, likely the fifth metatarsal. Mildly displaced fracture of the lateral aspect of the cuboid. No dislocation. There is diffuse soft tissue swelling of the foot. IMPRESSION: Overall reduction of the displacement of the distal metatarsal fractures with persistent plantar displacement of one of the metatarsals. Minimally displaced cuboid fracture. Electronically Signed   By: Elgie Collard M.D.   On: 07/08/2018 23:02   Dg Foot Complete Left  Result Date: 07/08/2018 CLINICAL DATA:  Pain after fall EXAM: LEFT FOOT - COMPLETE 3+ VIEW COMPARISON:  None. FINDINGS: Fractures are seen through the distal second, third, fourth, and fifth metatarsals with lateral displacement of the metatarsal heads. The remainder of the metatarsals are grossly intact. No definitive Lisfranc injury. The toes are intact. There appears to be a bony fragment adjacent to the cuboid. It least 2 metatarsals are displaced distally towards the plantar surface of the foot, seen on the lateral view. IMPRESSION: 1. Displaced fractures through the distal second, third, fourth,  and fifth metatarsals. The distal aspects of 2 metatarsals are displaced towards the plantar surface of the foot. While there is no definitive Lisfranc injury on this study, the lateral view is concerning. There is also an apparent cuboid fracture. Recommend a CT scan of the foot for better evaluation. Electronically Signed   By: Gerome Sam III M.D   On: 07/08/2018 18:44   Dg C-arm 1-60 Min  Result Date: 07/26/2018 CLINICAL DATA:  Open reduction internal fixation of left foot fractures. EXAM: DG C-ARM 61-120 MIN; LEFT FOOT - COMPLETE 3+ VIEW FLUOROSCOPY TIME:  35 seconds. COMPARISON:  Radiographs of July 08, 2018. FINDINGS: Three intraoperative fluoroscopic images of the left foot demonstrate surgical pinning of fractures involving the distal second, third, fourth and fifth metatarsals. Surgical fusion of the first and fourth tarsometatarsal joints is noted, as well as the cuneiform bones. IMPRESSION: Surgical internal fixation of multiple fractures as described above. Electronically Signed   By: Lupita Raider, M.D.   On: 07/26/2018 15:58   Dg C-arm 1-60 Min  Result Date: 07/26/2018 CLINICAL DATA:  Open reduction internal fixation of left foot fractures. EXAM: DG C-ARM 61-120 MIN; LEFT FOOT - COMPLETE 3+ VIEW FLUOROSCOPY TIME:  35 seconds. COMPARISON:  Radiographs of July 08, 2018. FINDINGS: Three intraoperative fluoroscopic images of the left foot demonstrate surgical pinning of fractures involving the distal second, third, fourth and fifth metatarsals. Surgical fusion of the first and fourth tarsometatarsal joints is noted, as well as the cuneiform bones. IMPRESSION:  Surgical internal fixation of multiple fractures as described above. Electronically Signed   By: Lupita Raider, M.D.   On: 07/26/2018 15:58   US Abdomen Limited Ruq  Result Date: 07/17/2018 CLINICAL DATA:  Transaminitis EXAM: ULTRASOUND ABDOMEN LIMITED RIGHT UPPER QUADRANT COMPARISON:  None. FINDINGS: Gallbladder: The  gallbladder is adequately distended with no evidence of stones, wall thickening, or pericholecystic fluid. There is no positive sonographic Murphy's sign. Common bile duct: Diameter: 4.2 mm. Liver: The hepatic echotexture is increased diffusely. The surface contour is smooth. There is an area of decreased echotexture adjacent to the gallbladder fossa. This likely reflects focal fatty sparing. There is no intrahepatic ductal dilation. Portal vein is patent on color Doppler imaging with normal direction of blood flow towards the liver. IMPRESSION: Increased hepatic echotexture most compatible with fatty infiltrative change. Normal appearing gallbladder and common bile duct. Electronically Signed   By: David  [place] M.D.   On: 07/17/2018 15:03    Microbiology: Recent Results (from the past 240 hour(s))  Surgical pcr screen     Status: None   Collection Time: 07/26/18  9:35 AM  Result Value Ref Range Status   MRSA, PCR NEGATIVE NEGATIVE Final   Staphylococcus aureus NEGATIVE NEGATIVE Final    Comment: (NOTE) The Xpert SA Assay (FDA approved for NASAL specimens in patients 26 years of age and older), is one component of a comprehensive surveillance program. It is not intended to diagnose infection nor to guide or monitor treatment. Performed at [facility], [address], [place], [place] [address]      Labs: Basic Metabolic Panel: Recent Labs  Lab 07/26/18 0331 07/27/18 0201 07/31/18 1412  NA 140 136 141  K 3.7 3.8 4.5  CL 106 103 102  CO2 26 23 23   GLUCOSE 125* 132* 112*  BUN 16 13 15   CREATININE 0.92 0.87 0.80  CALCIUM 9.5 9.1 10.5*   Liver Function Tests: Recent Labs  Lab 07/26/18 0331 07/27/18 0201 07/31/18 1412  AST 36 35 31  ALT 305* 219* 96*  ALKPHOS 65 61 88  BILITOT 1.0 1.0 0.8  PROT 7.2 6.5 7.9  ALBUMIN 3.5 3.4* 4.6   No results for input(s): LIPASE, AMYLASE in the last 168 hours. No results for input(s): AMMONIA in the last 168 hours. CBC: Recent  Labs  Lab 07/26/18 0331 07/31/18 1412  WBC 8.1 9.5  NEUTROABS 3.8 7.4*  HGB 13.0 13.9  HCT 40.0 41.4  MCV 88.9 88  PLT 353 522*   Cardiac Enzymes: No results for input(s): CKTOTAL, CKMB, CKMBINDEX, TROPONINI in the last 168 hours. BNP: BNP (last 3 results) No results for input(s): BNP in the last 8760 hours.  ProBNP (last 3 results) No results for input(s): PROBNP in the last 8760 hours.  CBG: No results for input(s): GLUCAP in the last 168 hours.     Signed:  Zannie Cove MD.  [facility] 08/01/2018, 4:26 PM

## 2018-08-02 ENCOUNTER — Telehealth: Payer: Self-pay

## 2018-08-02 DIAGNOSIS — F1910 Other psychoactive substance abuse, uncomplicated: Secondary | ICD-10-CM | POA: Insufficient documentation

## 2018-08-02 NOTE — Telephone Encounter (Signed)
-----   Message from Mike Gip, FNP sent at 08/01/2018  8:08 PM EDT ----- Your liver levels continue to decrease to a normal level. We will need to monitor this for several months. Continue with current medications. Decrease alcohol and tylenol use as this is metabolized through the liver. Remember to follow up.

## 2018-08-02 NOTE — Telephone Encounter (Signed)
Called, no answer and voicemail not set up. Thanks!

## 2018-08-03 NOTE — Telephone Encounter (Signed)
Called, no answer and no voicemail set up.  

## 2018-08-04 ENCOUNTER — Encounter: Payer: Self-pay | Admitting: Family Medicine

## 2018-08-04 ENCOUNTER — Ambulatory Visit (INDEPENDENT_AMBULATORY_CARE_PROVIDER_SITE_OTHER): Payer: Self-pay | Admitting: Family Medicine

## 2018-08-04 ENCOUNTER — Ambulatory Visit (HOSPITAL_COMMUNITY)
Admission: RE | Admit: 2018-08-04 | Discharge: 2018-08-04 | Disposition: A | Discharge status: Home / Self Care | Payer: Medicaid Other | Source: Ambulatory Visit | Attending: Family Medicine | Admitting: Family Medicine

## 2018-08-04 VITALS — BP 110/67 | HR 103 | Temp 99.4°F | Resp 16

## 2018-08-04 DIAGNOSIS — R05 Cough: Secondary | ICD-10-CM | POA: Diagnosis present

## 2018-08-04 DIAGNOSIS — J209 Acute bronchitis, unspecified: Secondary | ICD-10-CM

## 2018-08-04 MED ORDER — PREDNISONE 20 MG PO TABS: 60.0000 mg | ORAL_TABLET | Freq: Every day | ORAL | 0 refills | Status: AC

## 2018-08-04 MED ORDER — ALBUTEROL SULFATE (2.5 MG/3ML) 0.083% IN NEBU
2.5000 mg | INHALATION_SOLUTION | Freq: Once | RESPIRATORY_TRACT | Status: AC
  Administered 2018-08-04: 2.5 mg via RESPIRATORY_TRACT

## 2018-08-04 MED ORDER — DOXYCYCLINE HYCLATE 100 MG PO TABS: 100.0000 mg | ORAL_TABLET | Freq: Two times a day (BID) | ORAL | 0 refills | Status: DC

## 2018-08-04 MED ORDER — BENZONATATE 200 MG PO CAPS: 200.0000 mg | ORAL_CAPSULE | Freq: Three times a day (TID) | ORAL | 0 refills | Status: AC | PRN

## 2018-08-04 MED ORDER — IPRATROPIUM BROMIDE 0.02 % IN SOLN
0.5000 mg | Freq: Once | RESPIRATORY_TRACT | Status: AC
  Administered 2018-08-04: 0.5 mg via RESPIRATORY_TRACT

## 2018-08-04 MED ORDER — METHYLPREDNISOLONE SODIUM SUCC 125 MG IJ SOLR
125.0000 mg | Freq: Once | INTRAMUSCULAR | Status: AC
  Administered 2018-08-04: 125 mg via INTRAMUSCULAR

## 2018-08-04 NOTE — Progress Notes (Signed)
  [facility]   Progress Note: Sick Visit Provider: Mike Gip, FNP  SUBJECTIVE:   Daisean Brodhead is a 22 y.o. male who  has a past medical history of Anxiety, Complication of anesthesia (07/08/2018), and Medical history non-contributory.. Patient presents today for Cough (productive cough )  hx of hospitalization with intubation. Patient reports congestion and wheezing since being discharged on 07/27/2018. Patient with low grade fever today. Has not taken any medications for this.  Review of Systems  Respiratory: Positive for cough, sputum production, shortness of breath and wheezing. Negative for hemoptysis.   All other systems reviewed and are negative.    OBJECTIVE: BP 110/67 (BP Location: Right Arm, Patient Position: Sitting, Cuff Size: Large)   Pulse (!) 103   Temp 99.4 F (37.4 C) (Oral)   Resp 16   SpO2 98%   Physical Exam  Constitutional: He is oriented to person, place, and time. He appears well-developed and well-nourished. No distress.  HENT:  Head: Normocephalic and atraumatic.  Cardiovascular: Regular rhythm and normal heart sounds. Tachycardia present.  Pulmonary/Chest: Effort normal. He has no decreased breath sounds. He has wheezes in the right upper field, the right middle field, the right lower field, the left upper field, the left middle field and the left lower field. He has no rhonchi. He has no rales.  Neurological: He is alert and oriented to person, place, and time.  Skin: Skin is warm and dry.  Psychiatric: He has a normal mood and affect. His behavior is normal. Judgment and thought content normal.  Nursing note and vitals reviewed.   ASSESSMENT/PLAN:   1. Acute bronchitis, unspecified organism Duoneb in the office today.  - albuterol (PROVENTIL) (2.5 MG/3ML) 0.083% nebulizer solution 2.5 mg - ipratropium (ATROVENT) nebulizer solution 0.5 mg - doxycycline (VIBRA-TABS) 100 MG tablet; Take 1 tablet (100  mg total) by mouth 2 (two) times daily.  Dispense: 20 tablet; Refill: 0 - methylPREDNISolone sodium succinate (SOLU-MEDROL) 125 mg/2 mL injection 125 mg - predniSONE (DELTASONE) 20 MG tablet; Take 3 tablets (60 mg total) by mouth daily with breakfast for 5 days.  Dispense: 15 tablet; Refill: 0 - benzonatate (TESSALON) 200 MG capsule; Take 1 capsule (200 mg total) by mouth 3 (three) times daily as needed for up to 10 days for cough.  Dispense: 30 capsule; Refill: 0 - DG Chest 2 View; Future       The patient was given clear instructions to go to ER or return to medical center if symptoms do not improve, worsen or new problems develop. The patient verbalized understanding and agreed with plan of care.   Ms. Freda Jackson. Riley Lam, FNP-BC [facility] [address] [place], [place] [address] [phone]     This note has been created with Dragon speech recognition software and smart phrase technology. Any transcriptional errors are unintentional.

## 2018-08-04 NOTE — Patient Instructions (Addendum)

## 2018-08-04 NOTE — Telephone Encounter (Signed)
Patient came in office today. I have notified him of decrease liver levels and that we will continue to monitor. He was advised to decrease alcohol and tylenol use. Thanks!

## 2018-08-25 ENCOUNTER — Ambulatory Visit: Payer: Self-pay | Admitting: Family Medicine

## 2018-08-30 ENCOUNTER — Ambulatory Visit (INDEPENDENT_AMBULATORY_CARE_PROVIDER_SITE_OTHER): Payer: Self-pay | Admitting: Family Medicine

## 2018-08-30 VITALS — BP 124/71 | HR 84 | Temp 99.0°F | Resp 14

## 2018-08-30 DIAGNOSIS — R740 Nonspecific elevation of levels of transaminase and lactic acid dehydrogenase [LDH]: Secondary | ICD-10-CM

## 2018-08-30 DIAGNOSIS — R7401 Elevation of levels of liver transaminase levels: Secondary | ICD-10-CM

## 2018-08-30 NOTE — Progress Notes (Signed)
  [facility]   Progress Note: General Provider: Mike GipAndre Aqua Denslow, FNP  SUBJECTIVE:   Randy Mcintyre is a 22 y.o. male who  has a past medical history of Anxiety, Complication of anesthesia (07/08/2018), and Medical history non-contributory.. Patient presents today for Follow-up (2 week follow up on respitory/bronchitis ) Patient with a hx of elevated liver enzymes s/p transaminitis, shock and acute respiratory failure with hypoxia.  Patient reports completion of antibiotic and steroid therapy for bronchitis.  He states that he is feeling much better now.  No wheezing reported.  Denies shortness of breath chest pain or any other respiratory symptoms. Review of Systems  Constitutional: Negative.   HENT: Negative.   Eyes: Negative.   Respiratory: Negative.   Cardiovascular: Negative.   Gastrointestinal: Negative.   Genitourinary: Negative.   Musculoskeletal: Negative.        Left lower extremity in cast.   Skin: Negative.   Neurological: Negative.   Psychiatric/Behavioral: Negative.      OBJECTIVE: BP 124/71 (BP Location: Right Arm, Patient Position: Sitting, Cuff Size: Normal)   Pulse 84   Temp 99 F (37.2 C) (Oral)   Resp 14   SpO2 98%   Physical Exam  Constitutional: He is oriented to person, place, and time. He appears well-developed and well-nourished. No distress.  HENT:  Head: Normocephalic and atraumatic.  Eyes: Pupils are equal, round, and reactive to light. Conjunctivae and EOM are normal.  Neck: Normal range of motion.  Cardiovascular: Normal rate, regular rhythm, normal heart sounds and intact distal pulses.  Pulmonary/Chest: Effort normal and breath sounds normal. No respiratory distress.  Abdominal: Soft. Bowel sounds are normal. He exhibits no distension.  Musculoskeletal: Normal range of motion.  Left lower extremity in cast. Ambulating with crutches.   Neurological: He is alert and oriented to person, place, and  time.  Skin: Skin is warm and dry.  Psychiatric: He has a normal mood and affect. His behavior is normal. Thought content normal.  Nursing note and vitals reviewed.   ASSESSMENT/PLAN:  Transaminitis Continue to limit alcohol and tylenol use. Hepatic function panel ordered today.Pending labs. Will adjust medications accordingly.    - Hepatic Function Panel   Bronchitis- resolved.      The patient was given clear instructions to go to ER or return to medical center if symptoms do not improve, worsen or new problems develop. The patient verbalized understanding and agreed with plan of care.   Ms. Randy Jacksonndr L. Riley Lamouglas, FNP-BC [facility] [address]  [place], [place] [id number] [phone]     This note has been created with Dragon speech recognition software and smart phrase technology. Any transcriptional errors are unintentional.

## 2018-08-30 NOTE — Patient Instructions (Signed)
Liver Function Tests  Liver function tests are blood tests to see how well your liver is working. The proteins and enzymes measured in the test can alert your health care provider to inflammation, damage, or disease in your liver. It is common to have liver function tests:   During annual physical exams.   When you are taking certain medicines.   If you have liver disease.   If you drink a lot of alcohol.   When you are not feeling well.   When you have other conditions that may affect the liver.    Substances measured may include:   Alanine transaminase (ALT). This is an enzyme in the liver.   Aspartate transaminase (AST). This is an enzyme in the liver, heart, and muscles.   Alkaline phosphatase (ALP). This is a protein in the liver, bile ducts, and bone. It is also in other body tissues.   Total bilirubin. This is a yellow pigment in bile.   Albumin. This is a protein in the liver.   Prothrombin time and international normalized ratio (PT and INR). PT measures the time that it takes for your blood to clot. INR is a calculation of blood clotting time based upon your PT result. It is also calculated based on normal ranges defined by the laboratory that processed your lab test.   Total protein. This measures two proteins, albumin and globulin, found in the blood.    How do I prepare for this test?  How you prepare will depend on which tests are being done and the reason why these tests are being done. You may need to:   Avoid eating for 4-6 hours before the test or as directed by your health care provider.   Stop taking certain medicines prior to your blood test as directed by your health care provider.    What do the results mean?  It is your responsibility to obtain your test results. Ask the lab or department performing the test when and how you will get your results. Contact your health care provider to discuss any questions you have about your results.  RANGE OF NORMAL VALUES  Ranges for normal  values may vary among different labs and hospitals. You should always check with your health care provider after having lab work or other tests done to discuss the meaning of your test results and whether your values are considered within normal limits.  The following are normal ranges for substances measured in liver function tests:  ALT   Infant: may be twice as high as adult values.   Child or adult: 4-36 international units/L at 37C or 4-36 units/L (SI units).   Elderly: may be slightly higher than adult values.  AST   Newborn 0-5 days old: 35-140 units/L.   Child under 3 years old: 15-60 units/L.   3-6 years old: 15-50 units/L.   6-12 years old: 10-50 units/L.   12-18 years old: 10-40 units/L.   Adult: 0-35 units/L or 0-0.58 microkatal/L (SI units).   Elderly: slightly higher than adults.  ALP   Child under 2 years old: 85-235 units/L.   2-8 years old: 65-210 units/L.   9-15 years old: 60-300 units/L.   16-21 years old: 30-200 units/L.   Adult: 30-120 units/L or 0.5-2.0 microkatal/L (SI units).   Elderly: slightly higher than adult.  Total bilirubin   Newborn: 1.0-12.0 mg/dL or 17.1-205 micromoles/L (SI units).   Adult, elderly, or child: 0.3-1.0 mg/dL or 5.1-17 micromoles/L.  Albumin     Premature infant: 3.0-4.2 g/dL.   Newborn: 3.5-5.4 g/dL.   Infant: 4.4-5.4 g/dL.   Child: 4.0-5.9 g/dL.   Adult or elderly: 3.5-5.0 g/dL or 35-50 g/L (SI units).  PT   11.0-12.5 seconds; 85%-100%.  INR   0.8-1.1.  Total protein   Premature infant: 4.2-7.6 g/dL.   Newborn: 4.6-7.4 g/dL.   Infant: 6.0-6.7 g/dL.   Child: 6.2-8.0 g/dL.   Adult or elderly: 6.4-8.3 g/dL or 64-83 g/L (SI units).  MEANING OF RESULTS OUTSIDE NORMAL VALUE RANGES  Sometimes test results can be abnormal due to other factors, such as medicines, exercise, or pregnancy. Follow up with your health care provider if you have any questions about test results outside the normal value ranges.  ALT   Levels above the normal range,  along with other test results, may indicate liver disease.  AST   Levels above the normal range, along with other test results, may indicate liver disease. Sometimes levels also increase after burns, surgery, heart attack, muscle damage, or seizure.  ALP   Levels above the normal range, along with other test results, may indicate biliary obstruction, diseases of the liver, bone disease, thyroid disease, tumors, fractures, leukemia or lymphoma, or several other conditions. People with blood type O or B may show higher levels after a fatty meal.   Levels below the normal range, along with other test results, may indicate bone and teeth conditions, malnutrition, protein deficiency, or Wilson disease.  Total bilirubin   Levels above the normal range, along with other test results, may indicate problems with the liver, gallbladder, or bile ducts.  Albumin   Levels above the normal range, along with other test results, may indicate dehydration. They may also be caused by a diet that is high in protein. Sometimes, the band placed around the upper arm during the process of drawing blood can cause the level of this protein in your blood to rise and give you a result above the normal range.   Levels below the normal range, along with other tests results, may indicate kidney disease, liver disease, or malabsorption of nutrients.  PT and INR   Levels above the normal range mean your blood is clotting slower than normal. This may be due to blood disorders, liver disorders, or low levels of vitamin K.  Total protein   Levels above the normal range, along with other test results, may be due to infection or other diseases.   Levels below the normal range, along with other test results, may be due to an immune system disorder, bleeding, burns, kidney disorder, liver disease, trouble absorbing or getting enough nutrients, or other conditions that affect the intestines.  Talk with your health care provider to discuss your  results, treatment options, and if necessary, the need for more tests. Talk with your health care provider if you have any questions about your results.  This information is not intended to replace advice given to you by your health care provider. Make sure you discuss any questions you have with your health care provider.  Document Released: 10/30/2004 Document Revised: 06/02/2016 Document Reviewed: 01/31/2014  Elsevier Interactive Patient Education  2018 [employer]

## 2018-08-31 LAB — HEPATIC FUNCTION PANEL
ALT: 21 IU/L (ref 0–44)
AST: 17 IU/L (ref 0–40)
Albumin: 4.5 g/dL (ref 3.5–5.5)
Alkaline Phosphatase: 73 IU/L (ref 39–117)
Bilirubin Total: 1 mg/dL (ref 0.0–1.2)
Bilirubin, Direct: 0.25 mg/dL (ref 0.00–0.40)
Total Protein: 6.8 g/dL (ref 6.0–8.5)

## 2018-09-01 ENCOUNTER — Telehealth: Payer: Self-pay

## 2018-09-01 NOTE — Telephone Encounter (Signed)
Letter Mailed to patient to advised of labs. Thanks!

## 2018-09-01 NOTE — Telephone Encounter (Signed)
-----   Message from Mike GipAndre Douglas, FNP sent at 08/31/2018  5:30 PM EST ----- Please let patient know that the liver function tests have returned to normal.  He can return to care as needed.

## 2018-09-13 ENCOUNTER — Other Ambulatory Visit (HOSPITAL_COMMUNITY): Payer: Self-pay

## 2019-02-28 ENCOUNTER — Ambulatory Visit: Payer: Medicaid Other | Admitting: Family Medicine

## 2019-02-28 ENCOUNTER — Other Ambulatory Visit: Payer: Self-pay

## 2019-06-20 ENCOUNTER — Encounter (HOSPITAL_COMMUNITY): Payer: Self-pay

## 2019-06-20 ENCOUNTER — Encounter (HOSPITAL_COMMUNITY): Payer: Self-pay | Admitting: *Deleted

## 2020-03-05 IMAGING — CT CT CERVICAL SPINE W/O CM
4 of 8 series · 10 of 33 positions shown, 11 images · non-contrast
Comparison: None.

CLINICAL DATA: Fall from tree stand

EXAM:
CT HEAD WITHOUT CONTRAST
CT CERVICAL SPINE WITHOUT CONTRAST
TECHNIQUE: Multidetector CT imaging of the head and cervical spine was
performed following the standard protocol without intravenous
contrast. Multiplanar CT image reconstructions of the cervical spine
were also generated.

[Series 9: c spine soft · axial · 0.29mm/px · z∈[-289,-181]mm · 3 of 110 slices shown]
[im 28/110  soft-tissue]
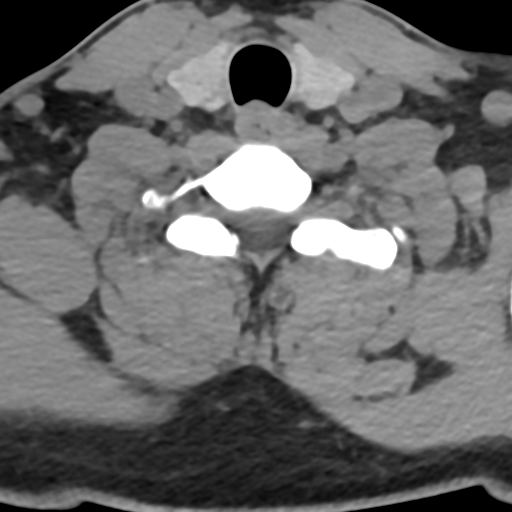
[im 55/110  soft-tissue]
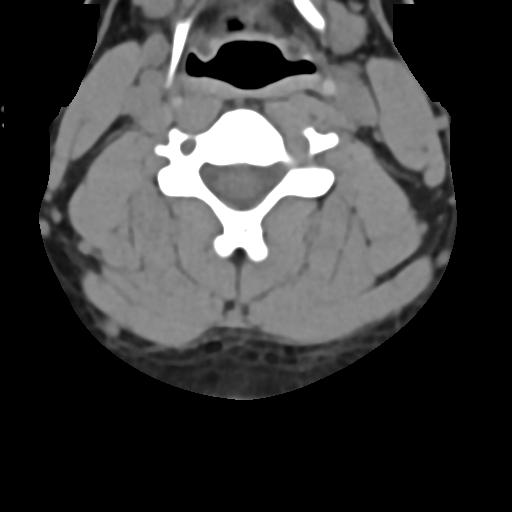
[im 82/110  soft-tissue]
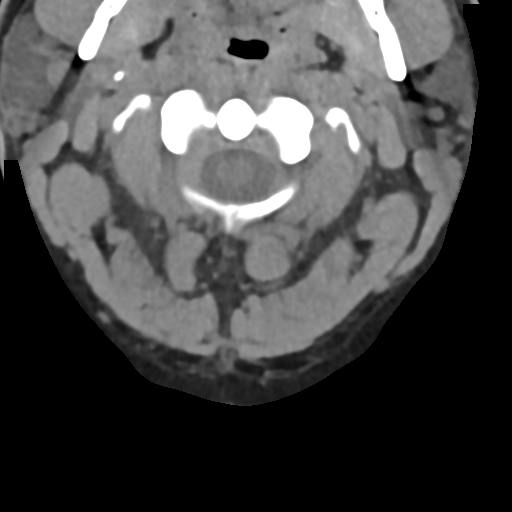

[Series 10: sag bone · sagittal · 0.35mm/px · 4 of 51 slices shown]
[im 11/51  bone]
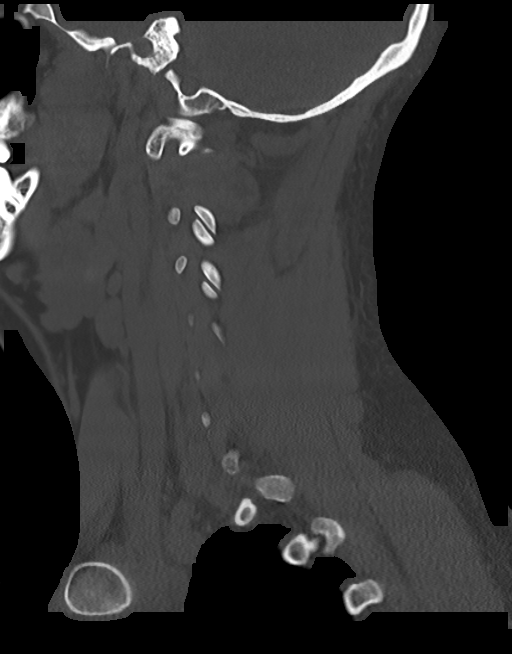
[im 21/51  bone]
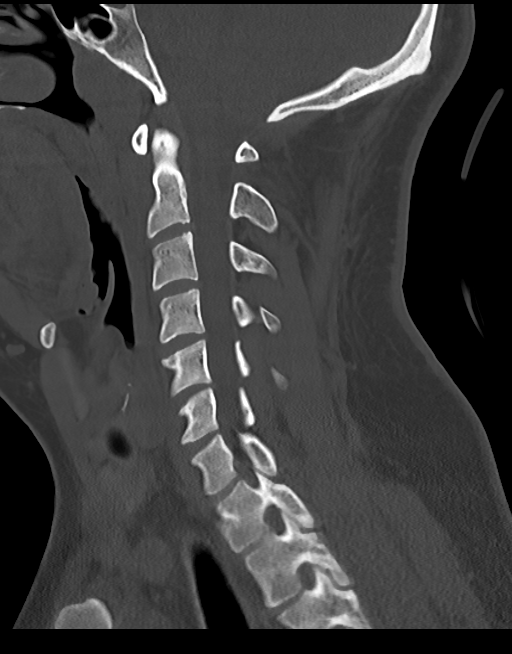
[im 31/51  bone]
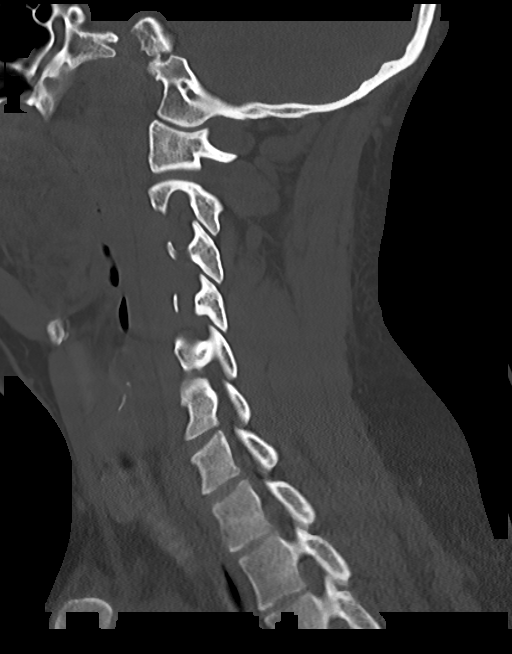
[im 41/51  bone]
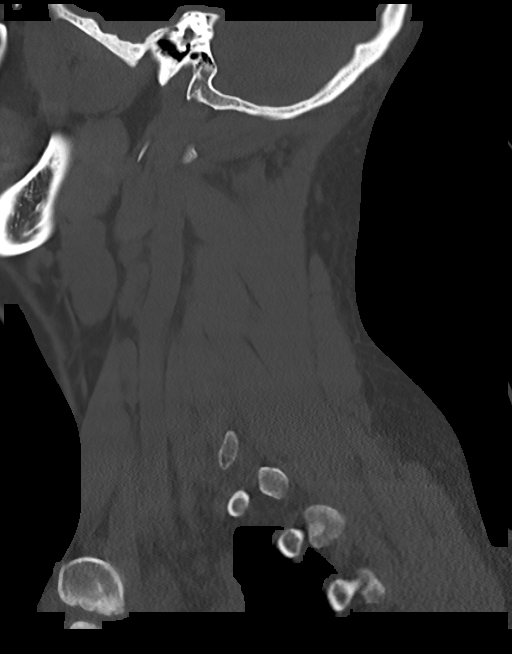

[Series 11: cor bone · coronal · 0.37mm/px · 1 of 51 slices shown]
[im 26/51  bone]
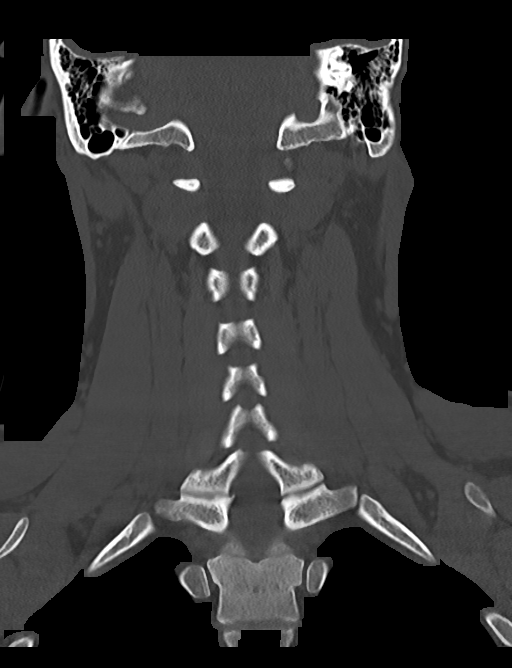

[Series 12: orthogonal axials · axial · 0.21mm/px · z∈[-294,-223]mm · 2 of 100 slices shown, 3 images]
[im 34/100  soft-tissue]
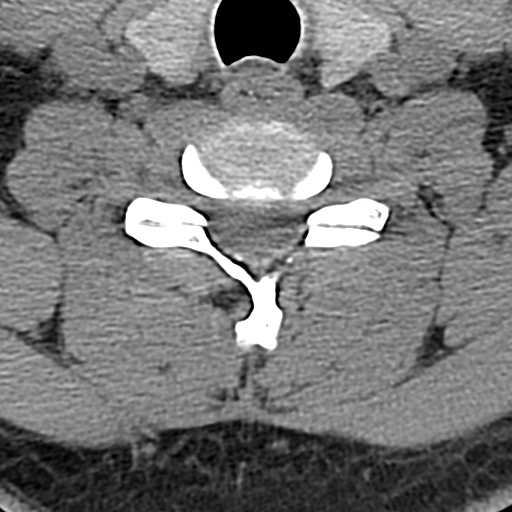
[im 34/100  bone]
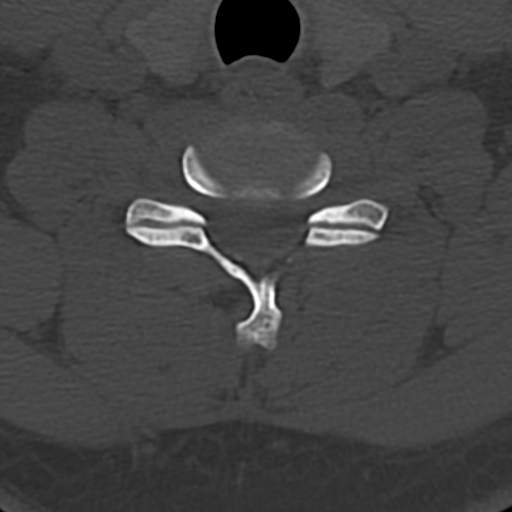
[im 67/100  bone]
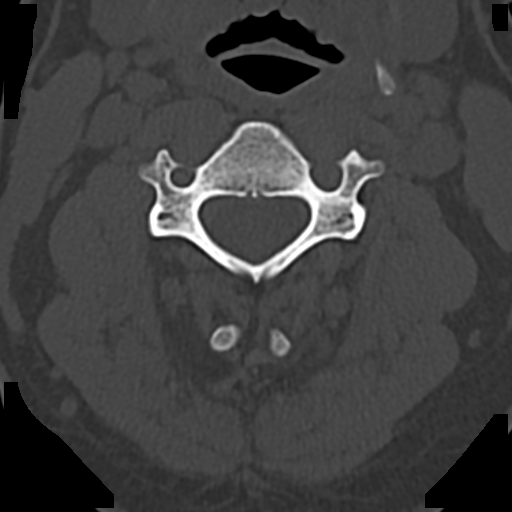

[10 of 33 positions shown; findings below may reference images not displayed]

FINDINGS: CT HEAD FINDINGS

Brain: There is no mass, hemorrhage or extra-axial collection. The
size and configuration of the ventricles and extra-axial CSF spaces
are normal. There is no acute or chronic infarction. The brain
parenchyma is normal.

Vascular: No abnormal hyperdensity of the major intracranial
arteries or dural venous sinuses. No intracranial atherosclerosis.

Skull: The visualized skull base, calvarium and extracranial soft
tissues are normal.

Sinuses/Orbits: No fluid levels or advanced mucosal thickening of
the visualized paranasal sinuses. No mastoid or middle ear effusion.
The orbits are normal.

CT CERVICAL SPINE FINDINGS

Alignment: No static subluxation. Facets are aligned. Occipital
condyles are normally positioned.

Skull base and vertebrae: No acute fracture.

Soft tissues and spinal canal: No prevertebral fluid or swelling. No
visible canal hematoma.

Disc levels: No advanced spinal canal or neural foraminal stenosis.

Upper chest: No pneumothorax, pulmonary nodule or pleural effusion.

Other: Normal visualized paraspinal cervical soft tissues.
IMPRESSION: Normal CT of the head and cervical spine.

## 2020-03-05 IMAGING — CR DG TIBIA/FIBULA 2V*L*
4 series · 4 of 4 positions shown · non-contrast
Comparison: None.

CLINICAL DATA: Pain after fall

EXAM:
LEFT TIBIA AND FIBULA - 2 VIEW

[tibia ap (1 of 2)]
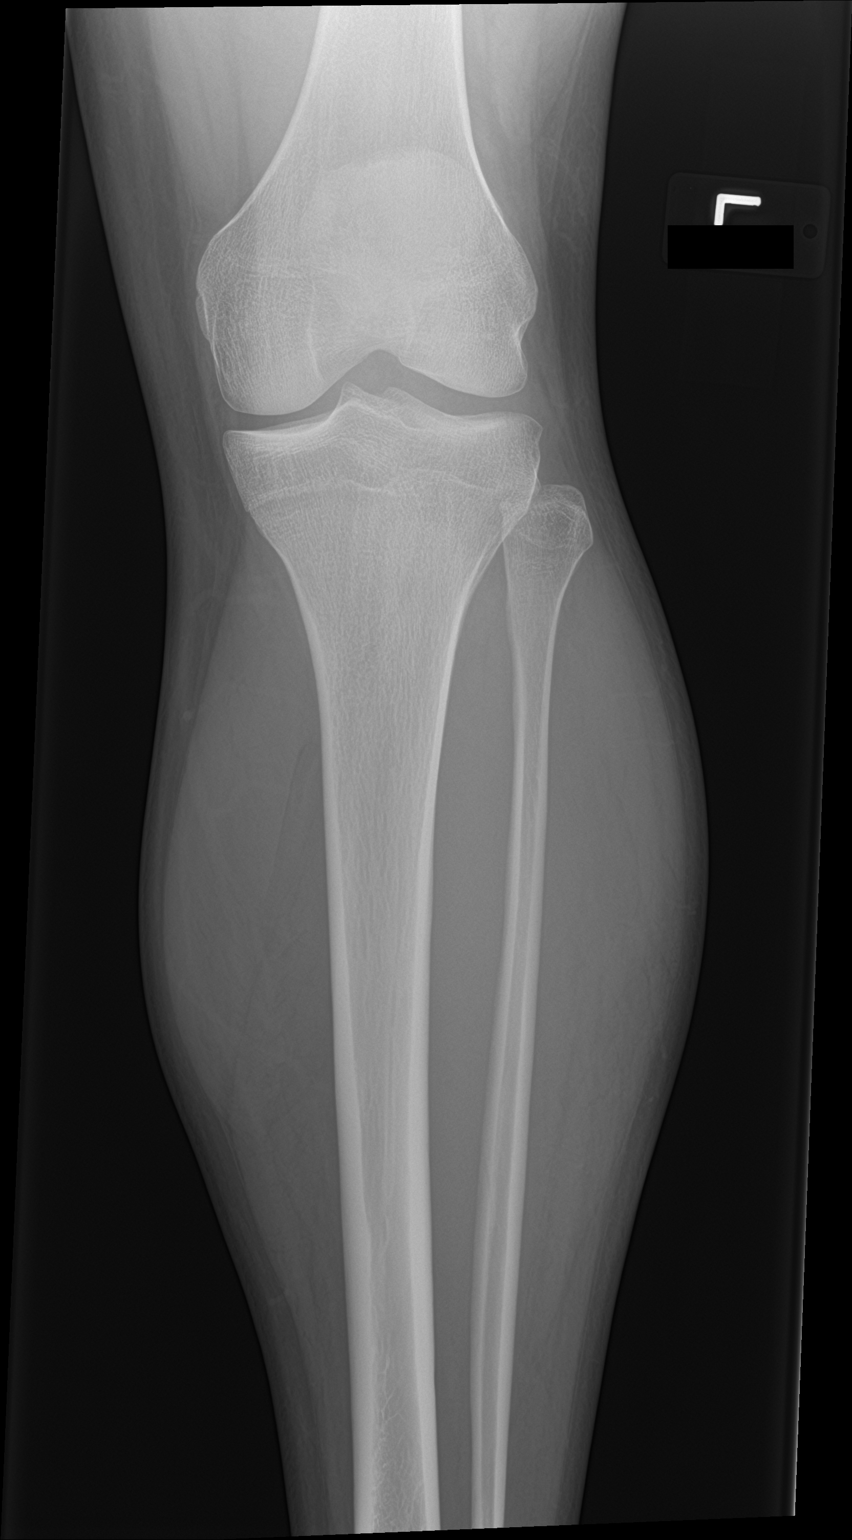

[tibia ap (2 of 2)]
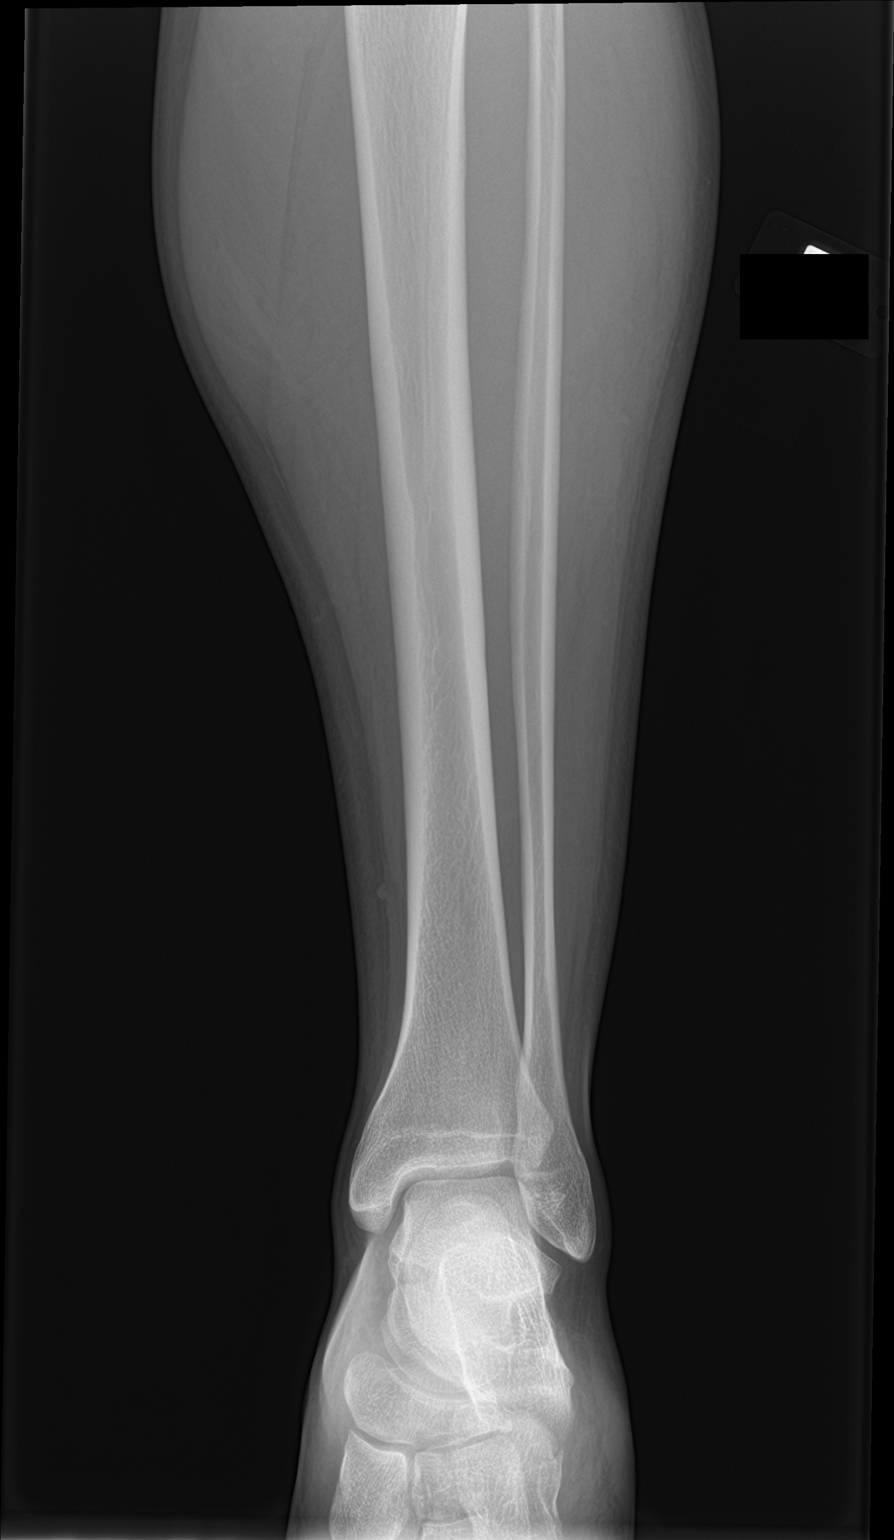

[tibia lat (1 of 2)]
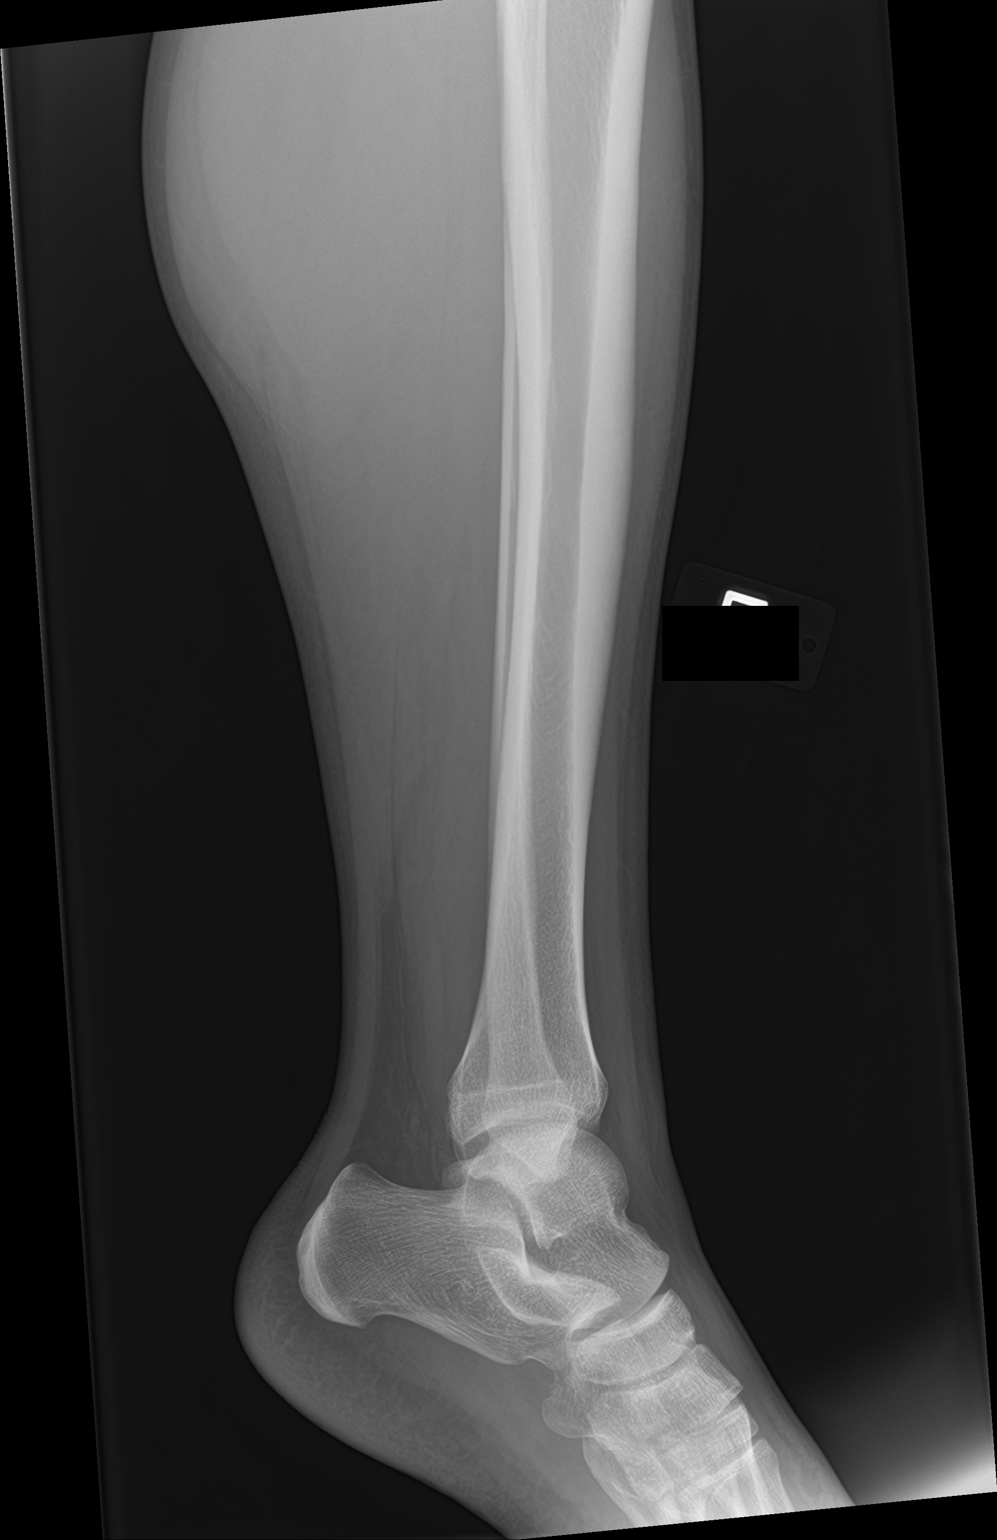

[tibia lat (2 of 2)]
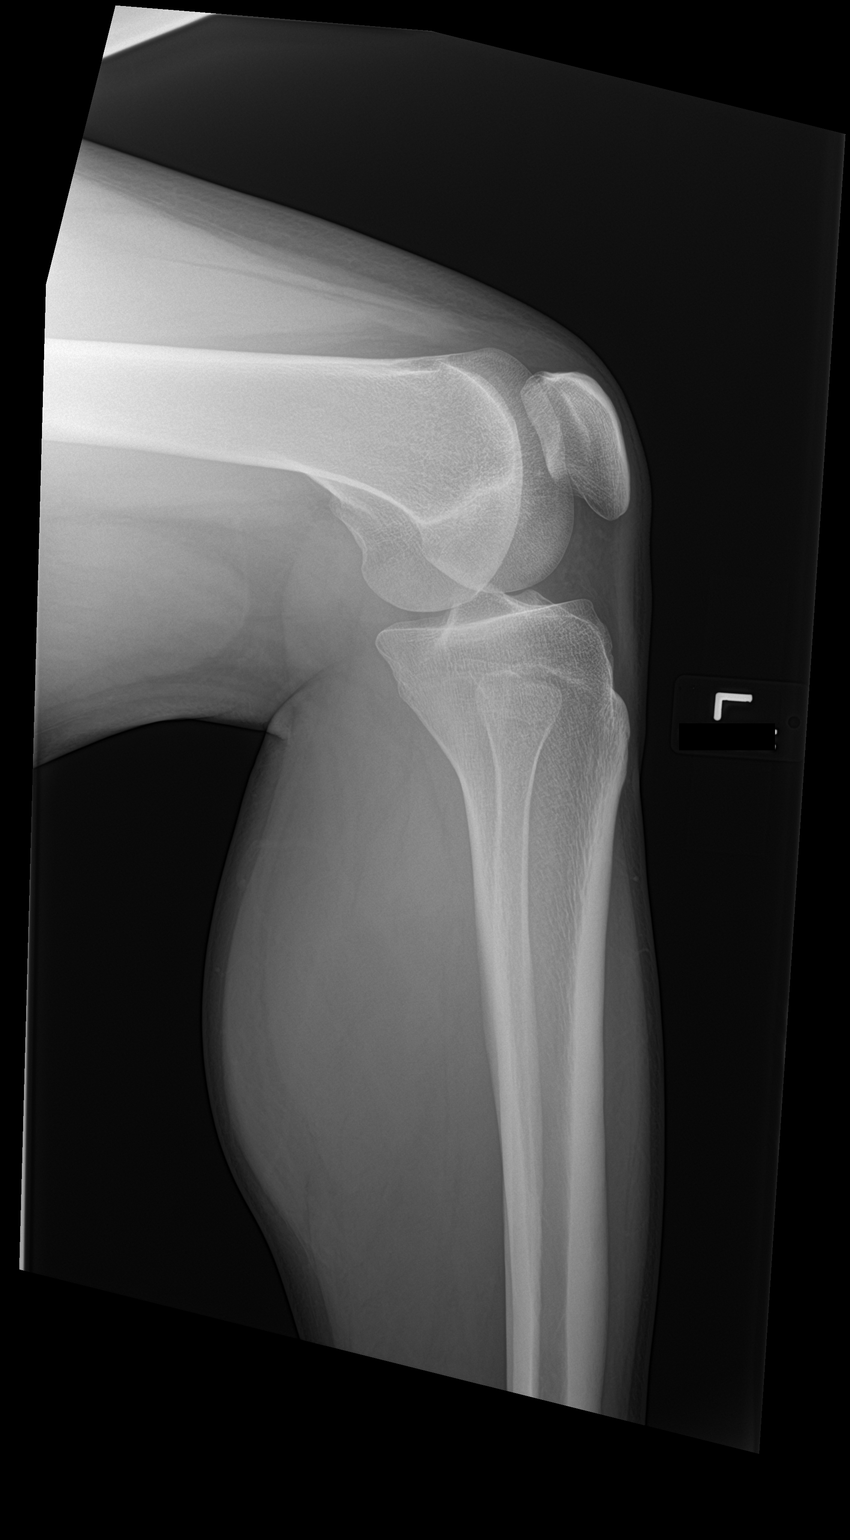

[4 of 4 positions shown; findings below may reference images not displayed]

FINDINGS: There is no evidence of fracture or other focal bone lesions. Soft
tissues are unremarkable.
IMPRESSION: Negative.

## 2020-10-20 ENCOUNTER — Other Ambulatory Visit: Payer: Self-pay

## 2020-10-20 ENCOUNTER — Ambulatory Visit
Admission: EM | Admit: 2020-10-20 | Discharge: 2020-10-20 | Disposition: A | Discharge status: Home / Self Care | Payer: Medicaid Other | Attending: Internal Medicine | Admitting: Internal Medicine

## 2020-10-20 ENCOUNTER — Encounter: Payer: Self-pay | Admitting: Emergency Medicine

## 2020-10-20 DIAGNOSIS — R6889 Other general symptoms and signs: Secondary | ICD-10-CM | POA: Diagnosis not present

## 2020-10-20 MED ORDER — BENZONATATE 100 MG PO CAPS: 100.0000 mg | ORAL_CAPSULE | Freq: Three times a day (TID) | ORAL | 0 refills | Status: DC

## 2020-10-20 MED ORDER — IBUPROFEN 600 MG PO TABS: 600.0000 mg | ORAL_TABLET | Freq: Four times a day (QID) | ORAL | 0 refills | Status: DC | PRN

## 2020-10-20 NOTE — Discharge Instructions (Signed)
Increase oral fluid intake Please quarantine until COVID-19 test results are available Ibuprofen as needed for fever and/for generalized body aches We will call you with recommendations if labs are abnormal Return to urgent care if symptoms worsen.

## 2020-10-20 NOTE — ED Provider Notes (Signed)
[facility]    CSN: [id number] Arrival date & time: 10/20/20  0951      History   Chief Complaint Chief Complaint  Patient presents with  . Fever  . Cough    HPI Randy Mcintyre is a 25 y.o. male comes to the urgent care with 1 day history of fever, chills and nonproductive cough.  Patient endorses some runny nose with some sore throat.  No fever or chills.  No nausea or vomiting.  No diarrhea.  No sick contact.  Patient is not vaccinated against COVID-19 virus.Marland Kitchen   HPI  Past Medical History:  Diagnosis Date  . Anxiety   . Complication of anesthesia 07/08/2018   pation had a reducation and splitting in ED- "they said that it took 3 times the amount it should have."  . Medical history non-contributory     Patient Active Problem List   Diagnosis Date Noted  . Substance abuse (HCC) 08/02/2018  . Pressure injury of skin 07/19/2018  . Acute renal failure (HCC)   . Acute respiratory failure with hypoxia (HCC)   . Transaminitis   . Altered mental status 07/16/2018  . Shock circulatory [facility])     Past Surgical History:  Procedure Laterality Date  . OPEN REDUCTION INTERNAL FIXATION (ORIF) FOOT LISFRANC FRACTURE Left 07/26/2018   Procedure: OPEN REDUCTION INTERNAL FIXATION (ORIF) FOOT LISFRANC  and metatarsal FRACTUREs;  Surgeon: Terance Hart, MD;  Location: [facility] OR;  Service: Orthopedics;  Laterality: Left;       Home Medications    Prior to Admission medications   Medication Sig Start Date End Date Taking? Authorizing Provider  benzonatate (TESSALON) 100 MG capsule Take 1 capsule (100 mg total) by mouth every 8 (eight) hours. 10/20/20  Yes Jadah Bobak, Britta Mccreedy, MD  ibuprofen (ADVIL) 600 MG tablet Take 1 tablet (600 mg total) by mouth every 6 (six) hours as needed. 10/20/20  Yes Yeraldy Spike, Britta Mccreedy, MD  aspirin 325 MG EC tablet Take 325 mg by mouth daily.    [provider]  gabapentin (NEURONTIN) 300 MG capsule Take 300 mg by mouth at bedtime.     [provider]  polyethylene glycol (MIRALAX / GLYCOLAX) packet Take 17 g by mouth daily.    [provider]    Family History History reviewed. No pertinent family history.  Social History Social History   Tobacco Use  . Smoking status: Current Some Day Smoker    Years: 7.00  . Smokeless tobacco: Current User    Types: Chew  . Tobacco comment: maybe 10 cigs a week,  Chew- 2 pouches a week   Vaping Use  . Vaping Use: Every day  Substance Use Topics  . Alcohol use: Yes    Alcohol/week: 24.0 standard drinks    Types: 24 Cans of beer per week  . Drug use: Yes    Types: Marijuana    Comment: last time 07/13/18     Allergies   Penicillins   Review of Systems Review of Systems  Constitutional: Positive for fever. Negative for chills and fatigue.  HENT: Positive for congestion and sore throat.   Respiratory: Positive for cough. Negative for shortness of breath and wheezing.   Gastrointestinal: Negative for nausea and vomiting.     Physical Exam Triage Vital Signs ED Triage Vitals  Enc Vitals Group     BP 10/20/20 1142 116/79     Pulse Rate 10/20/20 1142 90     Resp 10/20/20 1142 16  Temp 10/20/20 1142 99.8 F (37.7 C)     Temp Source 10/20/20 1142 Oral     SpO2 10/20/20 1142 96 %     Weight --      Height --      Head Circumference --      Peak Flow --      Pain Score 10/20/20 1141 0     Pain Loc --      Pain Edu? --      Excl. in GC? --    No data found.  Updated Vital Signs BP 116/79 (BP Location: Right Arm)   Pulse 90   Temp 99.8 F (37.7 C) (Oral)   Resp 16   SpO2 96%   Visual Acuity Right Eye Distance:   Left Eye Distance:   Bilateral Distance:    Right Eye Near:   Left Eye Near:    Bilateral Near:     Physical Exam Vitals and nursing note reviewed.  Constitutional:      General: He is not in acute distress.    Appearance: He is not ill-appearing.  Cardiovascular:     Rate and Rhythm: Normal rate and regular  rhythm.     Pulses: Normal pulses.     Heart sounds: Normal heart sounds.  Pulmonary:     Effort: Pulmonary effort is normal.     Breath sounds: Normal breath sounds. No wheezing or rhonchi.  Abdominal:     General: Abdomen is flat. Bowel sounds are normal.  Musculoskeletal:     Cervical back: Normal range of motion.  Neurological:     Mental Status: He is alert.      UC Treatments / Results  Labs (all labs ordered are listed, but only abnormal results are displayed) Labs Reviewed  COVID-19, FLU A+B NAA    EKG   Radiology No results found.  Procedures Procedures (including critical care time)  Medications Ordered in UC Medications - No data to display  Initial Impression / Assessment and Plan / UC Course  I have reviewed the triage vital signs and the nursing notes.  Pertinent labs & imaging results that were available during my care of the patient were reviewed by me and considered in my medical decision making (see chart for details).     1.  Flulike symptoms: COVID-19, flu a plus B PCR test done Ibuprofen 600 mg every 6 hours as needed for pain and/or fever Tessalon Perles as needed cough. Please quarantine until COVID-19 test results are available We will call you if your labs are abnormal. Final Clinical Impressions(s) / UC Diagnoses   Final diagnoses:  Flu-like symptoms     Discharge Instructions     Increase oral fluid intake Please quarantine until COVID-19 test results are available Ibuprofen as needed for fever and/for generalized body aches We will call you with recommendations if labs are abnormal Return to urgent care if symptoms worsen.   ED Prescriptions    Medication Sig Dispense Auth. Provider   ibuprofen (ADVIL) 600 MG tablet Take 1 tablet (600 mg total) by mouth every 6 (six) hours as needed. 30 tablet Sequita Wise, Britta Mccreedy, MD   benzonatate (TESSALON) 100 MG capsule Take 1 capsule (100 mg total) by mouth every 8 (eight) hours. 21  capsule Jailee Jaquez, Britta Mccreedy, MD     PDMP not reviewed this encounter.   Merrilee Jansky, MD 10/20/20 1304

## 2020-10-20 NOTE — ED Triage Notes (Signed)
Pt said he started running a fever yesterday and coughing. No congestion, but runny nose.

## 2020-10-22 LAB — COVID-19, FLU A+B NAA
Influenza A, NAA: NOT DETECTED
Influenza B, NAA: NOT DETECTED
SARS-CoV-2, NAA: DETECTED — AB

## 2022-03-26 ENCOUNTER — Encounter (HOSPITAL_COMMUNITY): Payer: Self-pay

## 2022-03-26 ENCOUNTER — Emergency Department (HOSPITAL_COMMUNITY)
Admission: EM | Admit: 2022-03-26 | Discharge: 2022-03-27 | Disposition: A | Discharge status: Home / Self Care | Payer: Medicaid Other | Attending: Emergency Medicine | Admitting: Emergency Medicine

## 2022-03-26 ENCOUNTER — Other Ambulatory Visit: Payer: Self-pay

## 2022-03-26 DIAGNOSIS — W109XXA Fall (on) (from) unspecified stairs and steps, initial encounter: Secondary | ICD-10-CM | POA: Diagnosis not present

## 2022-03-26 DIAGNOSIS — Z7982 Long term (current) use of aspirin: Secondary | ICD-10-CM | POA: Diagnosis not present

## 2022-03-26 DIAGNOSIS — S82392A Other fracture of lower end of left tibia, initial encounter for closed fracture: Secondary | ICD-10-CM | POA: Insufficient documentation

## 2022-03-26 DIAGNOSIS — S82892A Other fracture of left lower leg, initial encounter for closed fracture: Secondary | ICD-10-CM

## 2022-03-26 DIAGNOSIS — S99912A Unspecified injury of left ankle, initial encounter: Secondary | ICD-10-CM | POA: Diagnosis present

## 2022-03-26 NOTE — ED Triage Notes (Addendum)
Pt from home via [facility] c/o left ankle injury d/t falling down steps and twisting his left ankle, pt reports heard a "pop", left ankle noticeable swelling, EMS ortho supportive device to LLE. Prior left foot sx 2019. Pt denies hitting head during fall, denies syncopal episode. A&Ox4. NAD noted

## 2022-03-26 NOTE — ED Notes (Signed)
Pt refuses IV or IV pain meds at this time

## 2022-03-27 ENCOUNTER — Emergency Department (HOSPITAL_COMMUNITY): Payer: Medicaid Other

## 2022-03-27 MED ORDER — OXYCODONE-ACETAMINOPHEN 5-325 MG PO TABS
1.0000 | ORAL_TABLET | Freq: Once | ORAL | Status: AC
  Administered 2022-03-27: 1 via ORAL
  Filled 2022-03-27: qty 1

## 2022-03-27 MED ORDER — TRAMADOL HCL 50 MG PO TABS: 50.0000 mg | ORAL_TABLET | Freq: Four times a day (QID) | ORAL | 0 refills | Status: DC | PRN

## 2022-03-27 MED ORDER — NAPROXEN 375 MG PO TABS
375.0000 mg | ORAL_TABLET | Freq: Two times a day (BID) | ORAL | 0 refills | Status: DC
Start: 2022-03-27 — End: 2022-07-20

## 2022-03-27 MED ORDER — FENTANYL CITRATE PF 50 MCG/ML IJ SOSY
50.0000 ug | PREFILLED_SYRINGE | Freq: Once | INTRAMUSCULAR | Status: AC
  Administered 2022-03-27: 50 ug via INTRAVENOUS
  Filled 2022-03-27: qty 1

## 2022-03-27 MED ORDER — ONDANSETRON HCL 4 MG/2ML IJ SOLN
4.0000 mg | Freq: Once | INTRAMUSCULAR | Status: AC
  Administered 2022-03-27: 4 mg via INTRAVENOUS
  Filled 2022-03-27: qty 2

## 2022-03-27 NOTE — ED Notes (Signed)
PA Abigail notified pt request for additional pain medication after splint placement. Refer to [facility]

## 2022-03-27 NOTE — ED Notes (Signed)
Pt placed on 2L [place] after administration of IV fentanyl, sats reduce to 88% on RA, increased back up to mid 90s after 02 applied. Pt reports no complaints or discomfort.

## 2022-03-27 NOTE — ED Provider Notes (Signed)
[facility]-EMERGENCY DEPT Provider Note   CSN: [id number] Arrival date & time: 03/26/22  2323     History  Chief Complaint  Patient presents with   Ankle Pain    Adem Costlow is a 26 y.o. male who presents emergency department with chief complaint of left ankle injury.  He has a past medical history of previous left foot fracture with ORIF, acute renal failure, shock, and respiratory failure after accidental overdose.  Patient was walking down a set of stairs when he suffered an eversion injury of the left ankle.  He had immediate severe pain and swelling.  He rates his pain now at an 8 out of 10.  He complains of throbbing pain pain in the ankle with any movement.  He denies any other injuries.   Ankle Pain      Home Medications Prior to Admission medications   Medication Sig Start Date End Date Taking? Authorizing Provider  aspirin 325 MG EC tablet Take 325 mg by mouth daily.    [provider]  benzonatate (TESSALON) 100 MG capsule Take 1 capsule (100 mg total) by mouth every 8 (eight) hours. 10/20/20   LampteyBritta Mccreedy, MD  gabapentin (NEURONTIN) 300 MG capsule Take 300 mg by mouth at bedtime.    [provider]  ibuprofen (ADVIL) 600 MG tablet Take 1 tablet (600 mg total) by mouth every 6 (six) hours as needed. 10/20/20   Lamptey, Britta Mccreedy, MD  polyethylene glycol (MIRALAX / GLYCOLAX) packet Take 17 g by mouth daily.    [provider]      Allergies    Penicillins    Review of Systems   Review of Systems  Physical Exam Updated Vital Signs BP 134/73 (BP Location: Left Arm)   Pulse 94   Temp 98.1 F (36.7 C) (Oral)   Resp 19   Ht 5\' 9"  (1.753 m)   Wt 90.7 kg   SpO2 94%   BMI 29.53 kg/m  Physical Exam Vitals and nursing note reviewed.  Constitutional:      General: He is not in acute distress.    Appearance: He is well-developed. He is not diaphoretic.  HENT:     Head: Normocephalic and atraumatic.  Eyes:      General: No scleral icterus.    Conjunctiva/sclera: Conjunctivae normal.  Cardiovascular:     Rate and Rhythm: Normal rate and regular rhythm.     Heart sounds: Normal heart sounds.  Pulmonary:     Effort: Pulmonary effort is normal. No respiratory distress.     Breath sounds: Normal breath sounds.  Abdominal:     Palpations: Abdomen is soft.     Tenderness: There is no abdominal tenderness.  Musculoskeletal:     Cervical back: Normal range of motion and neck supple.     Comments: Well-healed midline surgical scar of the left foot.  The left ankle is examined in applied a removable splint from EMS.  He has severe swelling and what appears to be mortise widening on on the ankle, no obvious posterior or anterior displacement of the ankle joint.  DP and PT pulse are intact.  Patient is exquisitely tender to palpation and unable to move the joint without severe pain.  Cap refill less than 2-second, normal sensation  Skin:    General: Skin is warm and dry.  Neurological:     Mental Status: He is alert.  Psychiatric:        Behavior: Behavior normal.  ED Results / Procedures / Treatments   Labs (all labs ordered are listed, but only abnormal results are displayed) Labs Reviewed - No data to display  EKG None  Radiology No results found.  Procedures Procedures    Medications Ordered in ED Medications  fentaNYL (SUBLIMAZE) injection 50 mcg (has no administration in time range)  ondansetron (ZOFRAN) injection 4 mg (has no administration in time range)    ED Course/ Medical Decision Making/ A&P                           Medical Decision Making 27 year old male who presents with left ankle injury.  Imaging shows a distal tibial fracture with intra-articular extension.  No ankle mortise widening or displacement of the ankle.  The patient does not have evidence of a compartment syndrome.  Patient placed in posterior and stirrup splint, made nonweightbearing, pain addressed to  the emergency department PDMP reviewed during this encounter. Patient will need outpatient follow-up with orthopedics.  I have given him a referral to on-call orthopedist however he has an established relationship with another orthopedist and he may also follow-up with that physician.  Discussed return precautions.  He is neurovascularly intact after splint application.  Amount and/or Complexity of Data Reviewed Radiology: ordered and independent interpretation performed.  Risk Prescription drug management.            Final Clinical Impression(s) / ED Diagnoses Final diagnoses:  None    Rx / DC Orders ED Discharge Orders     None         Arthor Captain, PA-C 03/27/22 1808    Gilda Crease, MD 03/30/22 [phone]

## 2022-03-27 NOTE — ED Notes (Signed)
Pt given set of crutches, pt has used crutches in the past, pt able to verbalized proper use of crutches and possible risk to avoid such as stairs.

## 2022-03-27 NOTE — Progress Notes (Signed)
Orthopedic Tech Progress Note Patient Details:  Randy Mcintyre Feb 18, 1996 [id number]  Ortho Devices Type of Ortho Device: Short leg splint, Crutches Ortho Device/Splint Location: lle Ortho Device/Splint Interventions: Ordered, Application, Adjustment   Post Interventions Patient Tolerated: Well Instructions Provided: Adjustment of device, Care of device, Poper ambulation with device  [name] [name] 03/27/2022, 3:26 AM

## 2022-03-27 NOTE — ED Notes (Signed)
Called on-call ortho tech for [facility] to have someone come splint pt's LLE, they to arrive shortly. PA-C Harris aware.

## 2022-03-27 NOTE — Discharge Instructions (Addendum)
Do not bear weight on your ankle.  Use crutches at all times. Follow-up with Dr. Victorino Dike or you may try to follow-up with Dr. Susa Simmonds because you have an stab list relationship. Ice and elevate your ankle to reduce swelling and pain.  Contact a health care provider if: You have pain or swelling that gets worse or does not get better with rest or medicine. Your cast gets damaged. Get help right away if: You have severe pain that lasts. You develop new pain or swelling. Your skin or toenails below the injury turn blue or gray, feel cold, become numb, or are less sensitive to the touch.

## 2022-03-27 NOTE — ED Notes (Signed)
Ortho tech arrived to apply splint

## 2022-03-27 NOTE — ED Notes (Signed)
Portable x-ray at the bedside.  

## 2022-03-29 ENCOUNTER — Other Ambulatory Visit: Payer: Self-pay | Admitting: Orthopaedic Surgery

## 2022-03-29 DIAGNOSIS — M25572 Pain in left ankle and joints of left foot: Secondary | ICD-10-CM

## 2022-03-30 ENCOUNTER — Ambulatory Visit
Admission: RE | Admit: 2022-03-30 | Discharge: 2022-03-30 | Disposition: A | Discharge status: Home / Self Care | Payer: Medicaid Other | Source: Ambulatory Visit | Attending: Orthopaedic Surgery | Admitting: Orthopaedic Surgery

## 2022-03-30 DIAGNOSIS — M25572 Pain in left ankle and joints of left foot: Secondary | ICD-10-CM

## 2022-03-31 ENCOUNTER — Other Ambulatory Visit: Payer: Self-pay | Admitting: Orthopaedic Surgery

## 2022-04-01 ENCOUNTER — Other Ambulatory Visit: Payer: Self-pay

## 2022-04-01 ENCOUNTER — Encounter (HOSPITAL_COMMUNITY): Payer: Self-pay | Admitting: Orthopaedic Surgery

## 2022-04-01 NOTE — Progress Notes (Signed)
PCP - pt denies Cardiologist - pt denies   ERAS [facility] -  COVID TEST- n/a  Anesthesia review: n/a  -------------  SDW INSTRUCTIONS:  Your procedure is scheduled on Monday 6/26. Please report to [facility] Main Entrance "A" at 1:30 P.M., and check in at the Admitting office. Call this number if you have problems the morning of surgery: [phone]   Remember: Do not eat after midnight the night before your surgery  You may drink clear liquids until 1PM the day of your surgery.   Clear liquids allowed are: Water, Non-Citrus Juices (without pulp), Carbonated Beverages, Clear Tea, Black Coffee Only, and Gatorade   Medications to take morning of surgery with a sip of water include: traMADol (ULTRAM) if needed  As of today, STOP taking any Aspirin (unless otherwise instructed by your surgeon), Aleve, Naproxen, Ibuprofen, Motrin, Advil, Goody's, BC's, all herbal medications, fish oil, and all vitamins.    The Morning of Surgery Do not wear jewelry Do not wear lotions, powders, colognes, or deodorant Do not bring valuables to the hospital. [facility] is not responsible for any belongings or valuables.  If you are a smoker, DO NOT Smoke 24 hours prior to surgery  If you wear a CPAP at night please bring your mask the morning of surgery   Remember that you must have someone to transport you home after your surgery, and remain with you for 24 hours if you are discharged the same day.  Please bring cases for contacts, glasses, hearing aids, dentures or bridgework because it cannot be worn into surgery.   Patients discharged the day of surgery will not be allowed to drive home.   Please shower the NIGHT BEFORE/MORNING OF SURGERY (use antibacterial soap like DIAL soap if possible). Wear comfortable clothes the morning of surgery. Oral Hygiene is also important to reduce your risk of infection.  Remember - BRUSH YOUR TEETH THE MORNING OF SURGERY WITH YOUR REGULAR TOOTHPASTE  Patient  denies shortness of breath, fever, cough and chest pain.

## 2022-04-04 NOTE — Anesthesia Preprocedure Evaluation (Addendum)
Anesthesia Evaluation  Patient identified by MRN, date of birth, ID band Patient awake    Reviewed: Allergy & Precautions, NPO status , Patient's Chart, lab work & pertinent test results  History of Anesthesia Complications (+) history of anesthetic complications ("pation had a reducation and splitting in ED- "they said that it took 3 times the amount it should have."")  Airway Mallampati: III  TM Distance: >3 FB Neck ROM: Full    Dental  (+) Teeth Intact, Dental Advisory Given   Pulmonary Current Smoker,  1-2cigg/d   Pulmonary exam normal breath sounds clear to auscultation       Cardiovascular negative cardio ROS Normal cardiovascular exam Rhythm:Regular Rate:Normal     Neuro/Psych PSYCHIATRIC DISORDERS Anxiety negative neurological ROS     GI/Hepatic negative GI ROS, (+)     substance abuse (daily marijuana, 6 pack per day)  marijuana use,   Endo/Other  negative endocrine ROS  Renal/GU negative Renal ROS  negative genitourinary   Musculoskeletal L distal tib fx   Abdominal   Peds  Hematology negative hematology ROS (+)   Anesthesia Other Findings   Reproductive/Obstetrics negative OB ROS                           Anesthesia Physical Anesthesia Plan  ASA: 2  Anesthesia Plan: General and Regional   Post-op Pain Management: Regional block*   Induction: Intravenous  PONV Risk Score and Plan: 2 and Ondansetron, Dexamethasone and Midazolam  Airway Management Planned: LMA  Additional Equipment: None  Intra-op Plan:   Post-operative Plan: Extubation in OR  Informed Consent: I have reviewed the patients History and Physical, chart, labs and discussed the procedure including the risks, benefits and alternatives for the proposed anesthesia with the patient or authorized representative who has indicated his/her understanding and acceptance.     Dental advisory given  Plan  Discussed with: CRNA  Anesthesia Plan Comments:        Anesthesia Quick Evaluation

## 2022-04-05 ENCOUNTER — Ambulatory Visit (HOSPITAL_COMMUNITY): Payer: Medicaid Other | Admitting: Anesthesiology

## 2022-04-05 ENCOUNTER — Encounter (HOSPITAL_COMMUNITY): Payer: Self-pay | Admitting: Orthopaedic Surgery

## 2022-04-05 ENCOUNTER — Other Ambulatory Visit: Payer: Self-pay

## 2022-04-05 ENCOUNTER — Ambulatory Visit (HOSPITAL_COMMUNITY)
Admission: RE | Admit: 2022-04-05 | Discharge: 2022-04-05 | Disposition: A | Discharge status: Home / Self Care | Payer: Medicaid Other | Source: Ambulatory Visit | Attending: Orthopaedic Surgery | Admitting: Orthopaedic Surgery

## 2022-04-05 ENCOUNTER — Encounter (HOSPITAL_COMMUNITY)
Admission: RE | Disposition: A | Discharge status: Home / Self Care | Payer: Self-pay | Source: Ambulatory Visit | Attending: Orthopaedic Surgery

## 2022-04-05 ENCOUNTER — Ambulatory Visit (HOSPITAL_COMMUNITY): Payer: Medicaid Other

## 2022-04-05 ENCOUNTER — Ambulatory Visit (HOSPITAL_BASED_OUTPATIENT_CLINIC_OR_DEPARTMENT_OTHER): Payer: Medicaid Other | Admitting: Anesthesiology

## 2022-04-05 DIAGNOSIS — X58XXXA Exposure to other specified factors, initial encounter: Secondary | ICD-10-CM | POA: Insufficient documentation

## 2022-04-05 DIAGNOSIS — S82872A Displaced pilon fracture of left tibia, initial encounter for closed fracture: Secondary | ICD-10-CM

## 2022-04-05 DIAGNOSIS — F1290 Cannabis use, unspecified, uncomplicated: Secondary | ICD-10-CM | POA: Insufficient documentation

## 2022-04-05 DIAGNOSIS — Y9301 Activity, walking, marching and hiking: Secondary | ICD-10-CM | POA: Diagnosis not present

## 2022-04-05 DIAGNOSIS — F17210 Nicotine dependence, cigarettes, uncomplicated: Secondary | ICD-10-CM | POA: Diagnosis not present

## 2022-04-05 HISTORY — PX: ORIF ANKLE FRACTURE: SHX5408

## 2022-04-05 LAB — CBC
HCT: 41.4 % (ref 39.0–52.0)
Hemoglobin: 14.2 g/dL (ref 13.0–17.0)
MCH: 30.7 pg (ref 26.0–34.0)
MCHC: 34.3 g/dL (ref 30.0–36.0)
MCV: 89.6 fL (ref 80.0–100.0)
Platelets: 235 10*3/uL (ref 150–400)
RBC: 4.62 MIL/uL (ref 4.22–5.81)
RDW: 12 % (ref 11.5–15.5)
WBC: 7.2 10*3/uL (ref 4.0–10.5)
nRBC: 0 % (ref 0.0–0.2)

## 2022-04-05 LAB — SURGICAL PCR SCREEN
MRSA, PCR: NEGATIVE
Staphylococcus aureus: POSITIVE — AB

## 2022-04-05 SURGERY — OPEN REDUCTION INTERNAL FIXATION (ORIF) ANKLE FRACTURE
Anesthesia: Regional | Site: Ankle | Laterality: Left

## 2022-04-05 MED ORDER — VANCOMYCIN HCL IN DEXTROSE 1-5 GM/200ML-% IV SOLN
INTRAVENOUS | Status: AC
  Administered 2022-04-05: 1000 mg via INTRAVENOUS
  Filled 2022-04-05: qty 200

## 2022-04-05 MED ORDER — PROPOFOL 10 MG/ML IV BOLUS
INTRAVENOUS | Status: AC
  Filled 2022-04-05: qty 20

## 2022-04-05 MED ORDER — DIPHENHYDRAMINE HCL 50 MG/ML IJ SOLN
25.0000 mg | Freq: Once | INTRAMUSCULAR | Status: AC
  Administered 2022-04-05: 25 mg via INTRAVENOUS
  Filled 2022-04-05: qty 0.5

## 2022-04-05 MED ORDER — ACETAMINOPHEN 500 MG PO TABS
ORAL_TABLET | ORAL | Status: AC
  Administered 2022-04-05: 1000 mg via ORAL
  Filled 2022-04-05: qty 1

## 2022-04-05 MED ORDER — ASPIRIN 325 MG PO TABS: 325.0000 mg | ORAL_TABLET | Freq: Every day | ORAL | 0 refills | Status: AC

## 2022-04-05 MED ORDER — LACTATED RINGERS IV SOLN: INTRAVENOUS | Status: DC

## 2022-04-05 MED ORDER — CLINDAMYCIN PHOSPHATE 900 MG/50ML IV SOLN
900.0000 mg | Freq: Once | INTRAVENOUS | Status: AC
Start: 2022-04-05 — End: 2022-04-05
  Administered 2022-04-05: 900 mg via INTRAVENOUS
  Filled 2022-04-05: qty 50

## 2022-04-05 MED ORDER — MIDAZOLAM HCL 2 MG/2ML IJ SOLN
2.0000 mg | Freq: Once | INTRAMUSCULAR | Status: AC
Start: 2022-04-05 — End: 2022-04-05

## 2022-04-05 MED ORDER — PROPOFOL 10 MG/ML IV BOLUS
INTRAVENOUS | Status: DC | PRN
  Administered 2022-04-05: 50 mg via INTRAVENOUS
  Administered 2022-04-05: 200 mg via INTRAVENOUS

## 2022-04-05 MED ORDER — DEXAMETHASONE SODIUM PHOSPHATE 10 MG/ML IJ SOLN
INTRAMUSCULAR | Status: DC | PRN
  Administered 2022-04-05: 5 mg via INTRAVENOUS

## 2022-04-05 MED ORDER — LIDOCAINE 2% (20 MG/ML) 5 ML SYRINGE
INTRAMUSCULAR | Status: DC | PRN
  Administered 2022-04-05: 20 mg via INTRAVENOUS
  Administered 2022-04-05: 40 mg via INTRAVENOUS

## 2022-04-05 MED ORDER — BUPIVACAINE HCL (PF) 0.5 % IJ SOLN
INTRAMUSCULAR | Status: DC | PRN
  Administered 2022-04-05: 40 mL via PERINEURAL

## 2022-04-05 MED ORDER — FENTANYL CITRATE (PF) 100 MCG/2ML IJ SOLN: 100.0000 ug | Freq: Once | INTRAMUSCULAR | Status: AC

## 2022-04-05 MED ORDER — DIPHENHYDRAMINE HCL 50 MG/ML IJ SOLN
INTRAMUSCULAR | Status: AC
  Filled 2022-04-05: qty 1

## 2022-04-05 MED ORDER — VANCOMYCIN HCL IN DEXTROSE 1-5 GM/200ML-% IV SOLN: 1000.0000 mg | INTRAVENOUS | Status: AC

## 2022-04-05 MED ORDER — OXYCODONE HCL 5 MG PO TABS
5.0000 mg | ORAL_TABLET | ORAL | 0 refills | Status: AC | PRN
Start: 2022-04-05 — End: 2022-04-10

## 2022-04-05 MED ORDER — ACETAMINOPHEN 500 MG PO TABS
1000.0000 mg | ORAL_TABLET | Freq: Once | ORAL | Status: AC
Start: 2022-04-05 — End: 2022-04-05

## 2022-04-05 MED ORDER — CHLORHEXIDINE GLUCONATE 0.12 % MT SOLN: 15.0000 mL | Freq: Once | OROMUCOSAL | Status: AC

## 2022-04-05 MED ORDER — MIDAZOLAM HCL 2 MG/2ML IJ SOLN
INTRAMUSCULAR | Status: AC
  Administered 2022-04-05: 2 mg via INTRAVENOUS
  Filled 2022-04-05: qty 2

## 2022-04-05 MED ORDER — FENTANYL CITRATE (PF) 250 MCG/5ML IJ SOLN
INTRAMUSCULAR | Status: AC
  Filled 2022-04-05: qty 5

## 2022-04-05 MED ORDER — SUCCINYLCHOLINE CHLORIDE 200 MG/10ML IV SOSY
PREFILLED_SYRINGE | INTRAVENOUS | Status: AC
  Filled 2022-04-05: qty 10

## 2022-04-05 MED ORDER — ONDANSETRON HCL 4 MG/2ML IJ SOLN
INTRAMUSCULAR | Status: DC | PRN
  Administered 2022-04-05: 4 mg via INTRAVENOUS

## 2022-04-05 MED ORDER — ORAL CARE MOUTH RINSE
15.0000 mL | Freq: Once | OROMUCOSAL | Status: AC
Start: 2022-04-05 — End: 2022-04-05
  Administered 2022-04-05: 15 mL via OROMUCOSAL

## 2022-04-05 MED ORDER — LIDOCAINE 2% (20 MG/ML) 5 ML SYRINGE
INTRAMUSCULAR | Status: AC
Start: 2022-04-05 — End: ?
  Filled 2022-04-05: qty 15

## 2022-04-05 MED ORDER — CHLORHEXIDINE GLUCONATE 0.12 % MT SOLN
OROMUCOSAL | Status: AC
  Filled 2022-04-05: qty 15

## 2022-04-05 MED ORDER — DEXAMETHASONE SODIUM PHOSPHATE 10 MG/ML IJ SOLN
INTRAMUSCULAR | Status: DC | PRN
  Administered 2022-04-05: 10 mg

## 2022-04-05 MED ORDER — DEXAMETHASONE SODIUM PHOSPHATE 10 MG/ML IJ SOLN
INTRAMUSCULAR | Status: AC
  Filled 2022-04-05: qty 2

## 2022-04-05 MED ORDER — PHENYLEPHRINE 80 MCG/ML (10ML) SYRINGE FOR IV PUSH (FOR BLOOD PRESSURE SUPPORT)
PREFILLED_SYRINGE | INTRAVENOUS | Status: AC
  Filled 2022-04-05: qty 10

## 2022-04-05 MED ORDER — ONDANSETRON HCL 4 MG/2ML IJ SOLN
INTRAMUSCULAR | Status: AC
Start: 2022-04-05 — End: ?
  Filled 2022-04-05: qty 6

## 2022-04-05 MED ORDER — FENTANYL CITRATE (PF) 250 MCG/5ML IJ SOLN
INTRAMUSCULAR | Status: DC | PRN
Start: 2022-04-05 — End: 2022-04-05
  Administered 2022-04-05 (×2): 50 ug via INTRAVENOUS

## 2022-04-05 MED ORDER — FENTANYL CITRATE (PF) 100 MCG/2ML IJ SOLN
INTRAMUSCULAR | Status: AC
  Administered 2022-04-05: 100 ug via INTRAVENOUS
  Filled 2022-04-05: qty 2

## 2022-04-05 MED ORDER — 0.9 % SODIUM CHLORIDE (POUR BTL) OPTIME
TOPICAL | Status: DC | PRN
  Administered 2022-04-05: 1000 mL

## 2022-04-05 SURGICAL SUPPLY — 72 items
ALCOHOL 70% 16 OZ (MISCELLANEOUS) ×1 IMPLANT
BAG COUNTER SPONGE SURGICOUNT (BAG) ×3 IMPLANT
BANDAGE ESMARK 6X9 LF (GAUZE/BANDAGES/DRESSINGS) IMPLANT
BIT DRILL 2.5 CANN STRL (BIT) ×2 IMPLANT
BLADE SURG 15 STRL LF DISP TIS (BLADE) ×2 IMPLANT
BLADE SURG 15 STRL SS (BLADE) ×3
BNDG COHESIVE 4X5 TAN STRL (GAUZE/BANDAGES/DRESSINGS) IMPLANT
BNDG COHESIVE 6X5 TAN STRL LF (GAUZE/BANDAGES/DRESSINGS) IMPLANT
BNDG ELASTIC 4X5.8 VLCR STR LF (GAUZE/BANDAGES/DRESSINGS) ×4 IMPLANT
BNDG ELASTIC 6X10 VLCR STRL LF (GAUZE/BANDAGES/DRESSINGS) ×1 IMPLANT
BNDG ESMARK 6X9 LF (GAUZE/BANDAGES/DRESSINGS)
CANISTER SUCT 3000ML PPV (MISCELLANEOUS) ×3 IMPLANT
CHLORAPREP W/TINT 26 (MISCELLANEOUS) ×4 IMPLANT
COVER SURGICAL LIGHT HANDLE (MISCELLANEOUS) ×5 IMPLANT
CUFF TOURN SGL QUICK 34 (TOURNIQUET CUFF) ×3
CUFF TOURN SGL QUICK 42 (TOURNIQUET CUFF) IMPLANT
CUFF TRNQT CYL 34X4.125X (TOURNIQUET CUFF) ×2 IMPLANT
DRAPE C-ARM 42X72 X-RAY (DRAPES) ×2 IMPLANT
DRAPE C-ARMOR (DRAPES) ×2 IMPLANT
DRAPE OEC MINIVIEW 54X84 (DRAPES) ×1 IMPLANT
DRAPE U-SHAPE 47X51 STRL (DRAPES) ×3 IMPLANT
DRILL CANN 2.8 (DRILL) ×3
DRILL SRG 2.8XANKL PLAT (DRILL) ×1 IMPLANT
DRSG MEPITEL 4X7.2 (GAUZE/BANDAGES/DRESSINGS) ×1 IMPLANT
DRSG PAD ABDOMINAL 8X10 ST (GAUZE/BANDAGES/DRESSINGS) ×2 IMPLANT
DRSG XEROFORM 1X8 (GAUZE/BANDAGES/DRESSINGS) ×3 IMPLANT
ELECT REM PT RETURN 9FT ADLT (ELECTROSURGICAL) ×3
ELECTRODE REM PT RTRN 9FT ADLT (ELECTROSURGICAL) ×2 IMPLANT
GAUZE SPONGE 4X4 12PLY STRL (GAUZE/BANDAGES/DRESSINGS) ×2 IMPLANT
GAUZE SPONGE 4X4 12PLY STRL LF (GAUZE/BANDAGES/DRESSINGS) ×1 IMPLANT
GLOVE BIOGEL M STRL SZ7.5 (GLOVE) ×3 IMPLANT
GLOVE BIOGEL PI IND STRL 8 (GLOVE) ×2 IMPLANT
GLOVE BIOGEL PI INDICATOR 8 (GLOVE) ×1
GLOVE SRG 8 PF TXTR STRL LF DI (GLOVE) ×2 IMPLANT
GLOVE SURG ENC TEXT LTX SZ7.5 (GLOVE) ×3 IMPLANT
GLOVE SURG UNDER POLY LF SZ8 (GLOVE) ×3
GOWN STRL REUS W/ TWL LRG LVL3 (GOWN DISPOSABLE) ×2 IMPLANT
GOWN STRL REUS W/ TWL XL LVL3 (GOWN DISPOSABLE) ×4 IMPLANT
GOWN STRL REUS W/TWL LRG LVL3 (GOWN DISPOSABLE) ×3
GOWN STRL REUS W/TWL XL LVL3 (GOWN DISPOSABLE) ×6
GUIDEWIRE 1.6 (WIRE) ×9
GUIDEWIRE ORTH 157X1.6XTROC (WIRE) ×3 IMPLANT
KIT BASIN OR (CUSTOM PROCEDURE TRAY) ×3 IMPLANT
KIT TURNOVER KIT B (KITS) ×3 IMPLANT
NS IRRIG 1000ML POUR BTL (IV SOLUTION) ×3 IMPLANT
PACK ORTHO EXTREMITY (CUSTOM PROCEDURE TRAY) ×3 IMPLANT
PAD ARMBOARD 7.5X6 YLW CONV (MISCELLANEOUS) ×4 IMPLANT
PAD CAST 4YDX4 CTTN HI CHSV (CAST SUPPLIES) ×2 IMPLANT
PADDING CAST COTTON 4X4 STRL (CAST SUPPLIES) ×3
PLATE LOCK STRT 83 6H NS (Plate) ×2 IMPLANT
PLATE TIB AD SHORT 11H (Plate) ×2 IMPLANT
SCREW CORT TI FT ANKLE 3.5X20 (Screw) ×2 IMPLANT
SCREW LOCK VA 3.5X38 (Screw) ×2 IMPLANT
SCREW LOCK VA 3.5X42 (Screw) ×2 IMPLANT
SCREW LP TIT 3.5X32 (Screw) ×2 IMPLANT
SCREW LP TIT 3.5X34 (Screw) ×2 IMPLANT
SCREW LP TITANIUM 3.5X24MM (Screw) ×4 IMPLANT
SCREW NLOCK DIST 3.5X26 (Screw) ×2 IMPLANT
SCREW NLOCK PT 3.5X34 (Screw) ×2 IMPLANT
SCREW NLOCK PT 3.5X44 (Screw) ×2 IMPLANT
SPLINT PLASTER CAST XFAST 5X30 (CAST SUPPLIES) ×1 IMPLANT
SPLINT PLASTER XFAST SET 5X30 (CAST SUPPLIES) ×1
SPONGE T-LAP 18X18 ~~LOC~~+RFID (SPONGE) ×1 IMPLANT
SUCTION FRAZIER HANDLE 10FR (MISCELLANEOUS) ×3
SUCTION TUBE FRAZIER 10FR DISP (MISCELLANEOUS) ×2 IMPLANT
SUT ETHILON 3 0 PS 1 (SUTURE) ×7 IMPLANT
SUT MNCRL AB 3-0 PS2 27 (SUTURE) ×5 IMPLANT
SUT VIC AB 2-0 CT1 27 (SUTURE) ×3
SUT VIC AB 2-0 CT1 TAPERPNT 27 (SUTURE) ×3 IMPLANT
TOWEL GREEN STERILE (TOWEL DISPOSABLE) ×3 IMPLANT
TOWEL GREEN STERILE FF (TOWEL DISPOSABLE) ×3 IMPLANT
TUBE CONNECTING 12X1/4 (SUCTIONS) ×3 IMPLANT

## 2022-04-05 NOTE — Progress Notes (Signed)
Vanc 1000MG  paused for nerve block and restarted post block. Pt complains of arm itching at site of IV where vanc was connected. Some raised bumps and rash from where pt was scratching. Vanc stopped immediately. Line flushed. Dr. Nance Pew made aware and per verbal order for 25mg  benadryl. See MAR. Pt VSS, no itching or irritation anywhere else. Pt still has on [place] 2L from block. Call light available and within reach to pt. Will continue to monitor.

## 2022-04-05 NOTE — Transfer of Care (Signed)
Immediate Anesthesia Transfer of Care Note  Patient: Randy Mcintyre  Procedure(s) Performed: OPEN TREATMENT OF LEFT PILON ANKLE FRACTURE (Left: Ankle)  Patient Location: PACU  Anesthesia Type:General  Level of Consciousness: awake, alert  and oriented  Airway & Oxygen Therapy: Patient Spontanous Breathing  Post-op Assessment: Report given to RN and Post -op Vital signs reviewed and stable  Post vital signs: Reviewed and stable  Last Vitals:  Vitals Value Taken Time  BP 123/73 04/05/22 1829  Temp    Pulse 93 04/05/22 1830  Resp 17 04/05/22 1830  SpO2 96 % 04/05/22 1830  Vitals shown include unvalidated device data.  Last Pain:  Vitals:   04/05/22 1349  TempSrc:   PainSc: 5       Patients Stated Pain Goal: 2 (04/05/22 1349)  Complications: No notable events documented.

## 2022-04-06 ENCOUNTER — Encounter (HOSPITAL_COMMUNITY): Payer: Self-pay | Admitting: Orthopaedic Surgery

## 2022-04-16 NOTE — Op Note (Signed)
Randy Mcintyre male 26 y.o. 04/05/2022  PreOperative Diagnosis: Left pilon ankle fracture  PostOperative Diagnosis: same  PROCEDURE: Reduction internal fixation of distal tibia including weightbearing surface of the tibial plafond  SURGEON: Dub Mikes, MD  ASSISTANT: Jesse [place], PA-C.  He was necessary for patient positioning, prep, drape, assist with fracture reduction and placement of hardware.  ANESTHESIA: General LMA anesthesia with peripheral nerve block  FINDINGS: Displaced intra-articular distal tibia fracture  IMPLANTS: Arthrex anterior tibial plate with medial Arthrex recon plate  [id number]:26 y.o. malesustained the above injury while walking on uneven surfaces.  He was seen in the hospital where there is diagnosis with intra-articular distal tibia fracture.  CT scan demonstrated significant displacement with impaction of the tibial plafond.  Given these findings he was indicated for surgery.   Patient understood the risks, benefits and alternatives to surgery which include but are not limited to wound healing complications, infection, nonunion, malunion, need for further surgery as well as damage to surrounding structures. They also understood the potential for continued pain in that there were no guarantees of acceptable outcome After weighing these risks the patient opted to proceed with surgery.  PROCEDURE: Patient was identified in the preoperative holding area.  The left ankle was marked by myself.  Consent was signed by myself and the patient.  Block was performed by anesthesia in the preoperative holding area.  Patient was taken to the operative suite and placed supine on the operative table.  General LMA anesthesia was induced without difficulty. Bump was placed under the operative hip and bone foam was used.  All bony prominences were well padded.  Tourniquet was placed on the operative thigh.  Preoperative antibiotics were given. The extremity was prepped and  draped in the usual sterile fashion and surgical timeout was performed.  The limb was elevated and the tourniquet was inflated to 250 mmHg.   We began by making an anterior incision to the ankle joint.  This was an incision taken between tibialis anterior and extensor hallucis longus tendons.  It was taken sharply down through skin and subcutaneous tissue.  Blunt dissection was used to mobilize skin flaps.  The extensor retinacular tissue was incised in line with the incision.  The incision was then carried down and the tendons were mobilized.  The neurovascular bundle was identified, elevated and protected through the entirety of the case.  We were then able to gain access to the anterior distal tibia.  Subperiosteal dissection was carried down to identify the fracture site.  There is a large fracture fragment along the medial aspect of the distal tibia that was intra-articular.  We are able to mobilize this large fracture fragment.  The joint surface was inspected and there is impaction centrally about the joint.  We were able to mobilize the fracture site and then the fracture was able to be reduced under direct visualization and held provisionally with pointed reduction forceps and K wire fixation.  We are able to disimpact the joint surface within the ankle joint and then fit an anterior plate on the distal tibia.  The rafting locking screws were placed to stabilize the joint fragments and the plate was placed on the anterior tibia.  A combination of locking and nonlocking screws were placed.  Then we turned our attention to the large fragment along the medial side.  A recon plate was contoured and placed along the medial tibia.  This was done in a buttress fashion.  Then nonlocking screws were placed to  fixate the fracture.  Afterwards fluoroscopy confirmed appropriate position of the plate and hardware.  The fractures were inspected and found to be acceptably reduced.  The joint was successfully  reduced.  Then final fluoroscopic images were obtained.  The wounds were irrigated with normal saline.  They were then closed in a layered fashion using 3-0 Vicryl, 3-0 Monocryl and 3-0 nylon suture.  He was placed in a soft dressing and a short leg splint.  The tourniquet was released.  He was awakened from anesthesia and taken to recovery in stable condition.  There were no complications.  POST OPERATIVE INSTRUCTIONS: Nonweightbearing to operative extremity Keep splint intact Follow-up in 2 weeks for splint removal, suture removal if appropriate and nonweightbearing x-rays of the operative leg.  TOURNIQUET TIME:less than 2 hours  BLOOD LOSS:  Minimal         DRAINS: none         SPECIMEN: none       COMPLICATIONS:  * No complications entered in OR log *         Disposition: PACU - hemodynamically stable.         Condition: stable

## 2022-07-20 ENCOUNTER — Encounter: Payer: Self-pay | Admitting: Nurse Practitioner

## 2022-07-20 ENCOUNTER — Ambulatory Visit: Payer: Medicaid Other | Admitting: Nurse Practitioner

## 2022-07-20 VITALS — BP 126/78 | HR 86 | Temp 96.8°F | Resp 14 | Ht 69.0 in | Wt 189.4 lb

## 2022-07-20 DIAGNOSIS — H5789 Other specified disorders of eye and adnexa: Secondary | ICD-10-CM

## 2022-07-20 DIAGNOSIS — E663 Overweight: Secondary | ICD-10-CM | POA: Diagnosis not present

## 2022-07-20 DIAGNOSIS — Z0000 Encounter for general adult medical examination without abnormal findings: Secondary | ICD-10-CM | POA: Diagnosis not present

## 2022-07-20 DIAGNOSIS — H00024 Hordeolum internum left upper eyelid: Secondary | ICD-10-CM

## 2022-07-20 LAB — COMPREHENSIVE METABOLIC PANEL
ALT: 26 U/L (ref 0–53)
AST: 19 U/L (ref 0–37)
Albumin: 4.4 g/dL (ref 3.5–5.2)
Alkaline Phosphatase: 74 U/L (ref 39–117)
BUN: 13 mg/dL (ref 6–23)
CO2: 25 mEq/L (ref 19–32)
Calcium: 9.6 mg/dL (ref 8.4–10.5)
Chloride: 101 mEq/L (ref 96–112)
Creatinine, Ser: 0.92 mg/dL (ref 0.40–1.50)
GFR: 114.24 mL/min (ref >60.00)
Glucose, Bld: 89 mg/dL (ref 70–99)
Potassium: 3.8 mEq/L (ref 3.5–5.1)
Sodium: 137 mEq/L (ref 135–145)
Total Bilirubin: 1 mg/dL (ref 0.2–1.2)
Total Protein: 7.3 g/dL (ref 6.0–8.3)

## 2022-07-20 LAB — CBC
HCT: 42.8 % (ref 39.0–52.0)
Hemoglobin: 14.6 g/dL (ref 13.0–17.0)
MCHC: 34.1 g/dL (ref 30.0–36.0)
MCV: 89.3 fl (ref 78.0–100.0)
Platelets: 149 10*3/uL — ABNORMAL LOW (ref 150.0–400.0)
RBC: 4.79 Mil/uL (ref 4.22–5.81)
RDW: 13.2 % (ref 11.5–15.5)
WBC: 6.1 10*3/uL (ref 4.0–10.5)

## 2022-07-20 LAB — LIPID PANEL
Cholesterol: 186 mg/dL (ref 0–200)
HDL: 60 mg/dL (ref >39.00)
LDL Cholesterol: 94 mg/dL (ref 0–99)
NonHDL: 126.16
Total CHOL/HDL Ratio: 3
Triglycerides: 159 mg/dL — ABNORMAL HIGH (ref 0.0–149.0)
VLDL: 31.8 mg/dL (ref 0.0–40.0)

## 2022-07-20 LAB — HEMOGLOBIN A1C: Hgb A1c MFr Bld: 5.5 % (ref 4.6–6.5)

## 2022-07-20 LAB — TSH: TSH: 0.96 u[IU]/mL (ref 0.35–5.50)

## 2022-07-20 MED ORDER — ERYTHROMYCIN 5 MG/GM OP OINT: 1.0000 | TOPICAL_OINTMENT | Freq: Three times a day (TID) | OPHTHALMIC | 0 refills | Status: DC

## 2022-07-20 NOTE — Assessment & Plan Note (Signed)
Discussed age-appropriate immunizations and screening exams. 

## 2022-07-20 NOTE — Assessment & Plan Note (Signed)
Suspect for lumbar treat with erythromycin 0.5% ophthalmic ointment 3 times daily.  Did discuss proper hand hygiene when taking eyes.  Patient continue using over-the-counter analgesics as needed and warm compresses if beneficial.  If this does not start to improve patient will contact office and we will consider putting him on doxycycline or other oral antibiotic.

## 2022-07-20 NOTE — Progress Notes (Signed)
New Patient Office Visit  Subjective    Patient ID: Randy Mcintyre, male    DOB: 1996/08/22  Age: 26 y.o. MRN: [id number]  CC:  Chief Complaint  Patient presents with   Establish Care    Previous provider was with Urgent Care in [place]   Eye Problem    Stye in the left eye, recurrent issue in the past, Left upper eyelid red, swollen and some pain present. Started on 07/15/22    HPI Randy Mcintyre presents to establish care   Eye problem: left eye. Started on Thursday or Friday last week. States that he has been using warm compress. States it feels swollen. No discharge. No itching. Has been using ibuprofen with out relief.  No change in vision.  No foreign body per patient report.  States that his daughter was recently diagnosed with walking pneumonia but no other sick contacts.  for complete physical and follow up of chronic conditions.  Immunizations: -Tetanus: within a year -Influenza: refused -Shingles: Too young -Pneumonia: Too young - Covid: Refused  -HPV: Thinks that he is up-to-date  Diet: Fair diet.  2 meals a day. Lunch and supper cook at home. Water and some type sports drink Exercise: No regular exercise. 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 Feels rested. Does not snore.      Outpatient Encounter Medications as of 07/20/2022  Medication Sig   erythromycin ophthalmic ointment Place 1 Application into the left eye 3 (three) times daily. For a week   [DISCONTINUED] naproxen (NAPROSYN) 375 MG tablet Take 1 tablet (375 mg total) by mouth 2 (two) times daily with a meal.   No facility-administered encounter medications on file as of 07/20/2022.    Past Medical History:  Diagnosis Date   Anxiety     Complication of anesthesia 07/08/2018   pation had a reducation and splitting in ED- "they said that it took 3 times the amount it should have."   History of MRSA infection     Past Surgical History:  Procedure Laterality Date   OPEN REDUCTION INTERNAL FIXATION (ORIF) FOOT LISFRANC FRACTURE Left 07/26/2018   Procedure: OPEN REDUCTION INTERNAL FIXATION (ORIF) FOOT LISFRANC  and metatarsal FRACTUREs;  Surgeon: Terance Hart, MD;  Location: [facility] OR;  Service: Orthopedics;  Laterality: Left;   ORIF ANKLE FRACTURE Left 04/05/2022   Procedure: OPEN TREATMENT OF LEFT PILON ANKLE FRACTURE;  Surgeon: Terance Hart, MD;  Location: [facility] OR;  Service: Orthopedics;  Laterality: Left;    Family History  Problem Relation Age of Onset   Diabetes Mother    Diabetes Maternal Grandfather    Cancer Paternal Grandmother 17       breast cancer   COPD Paternal Grandfather     Social History   Socioeconomic History   Marital status: Married    Spouse name: hayley   Number of children: 2   Years of education: Not on file   Highest education level: Not on file  Occupational History   Not on file  Tobacco Use   Smoking status: Former    Packs/day: 0.50    Years: 7.00    Total pack years: 3.50    Types: Cigarettes    Quit date: 2019  Years since quitting: 4.7   Smokeless tobacco: Former    Types: Chew   Tobacco comments:    maybe 10 cigs a week,  Chew- 2 pouches a week   Vaping Use   Vaping Use: Former  Substance and Sexual Activity   Alcohol use: Not Currently    Comment: 3 to 4 beers a day   Drug use: Yes    Types: Marijuana    Comment: couple times a week and helps with anxiety   Sexual activity: Yes  Other Topics Concern   Not on file  Social History Narrative   Randy Mcintyre 26 years old and Randy Mcintyre 10 months      Self employed    Social Determinants of Health   Financial Resource Strain: Not on file  Food Insecurity: Not on file  Transportation Needs: Not on file   Physical Activity: Not on file  Stress: Not on file  Social Connections: Not on file  Intimate Partner Violence: Not on file    Review of Systems  Constitutional:  Negative for chills and fever.  HENT:  Negative for ear discharge, ear pain, sinus pain and sore throat.   Eyes:  Positive for pain. Negative for blurred vision, double vision, discharge and redness.  Respiratory:  Negative for shortness of breath.   Cardiovascular:  Negative for chest pain.  Gastrointestinal:  Positive for diarrhea (6 daily over the past 3 day). Negative for abdominal pain, blood in stool, nausea and vomiting.  Genitourinary:  Negative for dysuria and hematuria.  Neurological:  Negative for headaches.  Psychiatric/Behavioral:  Negative for hallucinations and suicidal ideas.         Objective    BP 126/78   Pulse 86   Temp (!) 96.8 F (36 C) (Temporal)   Resp 14   Ht 5\' 9"  (1.753 m)   Wt 189 lb 6 oz (85.9 kg)   SpO2 98%   BMI 27.97 kg/m   Physical Exam Vitals and nursing note reviewed. Exam conducted with a chaperone present [facility], RMA).  Constitutional:      Appearance: Normal appearance.  HENT:     Right Ear: Tympanic membrane, ear canal and external ear normal.     Left Ear: Tympanic membrane, ear canal and external ear normal.     Mouth/Throat:     Mouth: Mucous membranes are moist.     Pharynx: Oropharynx is clear.  Eyes:     Extraocular Movements: Extraocular movements intact.     Pupils: Pupils are equal, round, and reactive to light.     Comments: Swelling and redness. No discharge or changes in vision No gritty feeling  Cardiovascular:     Rate and Rhythm: Normal rate and regular rhythm.     Heart sounds: Normal heart sounds.  Pulmonary:     Effort: Pulmonary effort is normal.     Breath sounds: Normal breath sounds.  Abdominal:     General: Bowel sounds are normal. There is no distension.     Palpations: There is no mass.     Tenderness: There is no  abdominal tenderness.     Hernia: No hernia is present. There is no hernia in the left inguinal area or right inguinal area.  Genitourinary:    Penis: Normal.      Testes: Normal.     Epididymis:     Right: Normal.     Left: Normal.  Musculoskeletal:     Right lower leg: No edema.  Comments: Wearing an ankle brace from ortho  Lymphadenopathy:     Cervical: No cervical adenopathy.     Lower Body: No right inguinal adenopathy. No left inguinal adenopathy.  Skin:    General: Skin is warm.  Neurological:     General: No focal deficit present.     Mental Status: He is alert.     Comments: Bilateral  upper and lower extremity strength 5/5  Psychiatric:        Mood and Affect: Mood normal.        Behavior: Behavior normal.        Thought Content: Thought content normal.        Judgment: Judgment normal.         Assessment & Plan:   Problem List Items Addressed This Visit       Musculoskeletal and Integument   Hordeolum internum of left upper eyelid    Suspect for lumbar treat with erythromycin 0.5% ophthalmic ointment 3 times daily.  Did discuss proper hand hygiene when taking eyes.  Patient continue using over-the-counter analgesics as needed and warm compresses if beneficial.  If this does not start to improve patient will contact office and we will consider putting him on doxycycline or other oral antibiotic.      Relevant Medications   erythromycin ophthalmic ointment     Other   Eye swelling, left    Likely superficial infection.  We will treat with topical ointment      Relevant Medications   erythromycin ophthalmic ointment   Preventative health care - Primary    Discussed age-appropriate immunizations and screening exams.      Relevant Orders   CBC   Comprehensive metabolic panel   Hemoglobin A1c   TSH   Lipid panel   Overweight    Encouraged exercise inclusive of 30 minutes a day 5 times a week.      Relevant Orders   Hemoglobin A1c   Lipid  panel    Return in about 1 year (around 07/21/2023), or if your eye does not improve, for CPE and labs.   Audria Nine, NP

## 2022-07-20 NOTE — Assessment & Plan Note (Addendum)
Likely superficial infection.  We will treat with topical ointment

## 2022-07-20 NOTE — Patient Instructions (Signed)
Nice to see you today I will be in touch with the labs once I have them Let me know if your the eye does not improve, we will switch medications at that point Follow up in 1 year for your next physical, sooner if you need me

## 2022-07-20 NOTE — Assessment & Plan Note (Signed)
Encouraged exercise inclusive of 30 minutes a day 5 times a week.

## 2022-07-22 ENCOUNTER — Telehealth: Payer: Self-pay | Admitting: Nurse Practitioner

## 2022-07-22 ENCOUNTER — Other Ambulatory Visit: Payer: Self-pay | Admitting: Nurse Practitioner

## 2022-07-22 DIAGNOSIS — D696 Thrombocytopenia, unspecified: Secondary | ICD-10-CM

## 2022-07-22 MED ORDER — DOXYCYCLINE HYCLATE 100 MG PO TABS: 100.0000 mg | ORAL_TABLET | Freq: Two times a day (BID) | ORAL | 0 refills | Status: AC

## 2022-07-22 NOTE — Telephone Encounter (Signed)
Patient advised.

## 2022-07-22 NOTE — Telephone Encounter (Signed)
Started using eye ointment on 07/20/22-eye looked a little better better yesterday but over night and this morning was worse, swelling is present but not swollen shut, more painful than it was, no fever, no vision changes.No new symptoms just not improving and pain is a little worse. Patient stated provider mentioned trying oral medication instead

## 2022-07-22 NOTE — Telephone Encounter (Signed)
Pt called stating he came in for a stye in his eye on 07/20/22 & he was prescribed some cream, erythromycin ophthalmic ointment but now his eye is swollen. Pt stated he was told by Charmian Muff that he'd prescribe some antibiotic if the cream didn't work? Call back # [phone]

## 2022-07-22 NOTE — Telephone Encounter (Signed)
Can we see how long patient has been using the eye ointment. Also inform patient that the eye ointment is an antibiotic.   Has his eye swollen closed? Has the redness spread. Any new symptoms of vision change or loss

## 2022-07-22 NOTE — Telephone Encounter (Signed)
Doxycycline sent to pharmacy. If no improvement needs to be re-evaluated

## 2022-09-21 ENCOUNTER — Other Ambulatory Visit: Payer: Medicaid Other

## 2022-09-24 ENCOUNTER — Other Ambulatory Visit (INDEPENDENT_AMBULATORY_CARE_PROVIDER_SITE_OTHER): Payer: Medicaid Other

## 2022-09-24 DIAGNOSIS — D696 Thrombocytopenia, unspecified: Secondary | ICD-10-CM

## 2022-09-24 LAB — CBC
HCT: 44.6 % (ref 39.0–52.0)
Hemoglobin: 15 g/dL (ref 13.0–17.0)
MCHC: 33.7 g/dL (ref 30.0–36.0)
MCV: 90.8 fl (ref 78.0–100.0)
Platelets: 348 10*3/uL (ref 150.0–400.0)
RBC: 4.91 Mil/uL (ref 4.22–5.81)
RDW: 13.8 % (ref 11.5–15.5)
WBC: 6.5 10*3/uL (ref 4.0–10.5)

## 2022-12-07 ENCOUNTER — Ambulatory Visit: Payer: Medicaid Other | Admitting: Nurse Practitioner

## 2023-09-01 ENCOUNTER — Encounter: Payer: Self-pay | Admitting: Nurse Practitioner

## 2023-09-01 ENCOUNTER — Ambulatory Visit: Payer: Medicaid Other | Admitting: Nurse Practitioner

## 2023-09-01 VITALS — BP 110/70 | HR 73 | Temp 98.2°F | Ht 69.0 in | Wt 191.0 lb

## 2023-09-01 DIAGNOSIS — J0110 Acute frontal sinusitis, unspecified: Secondary | ICD-10-CM | POA: Diagnosis not present

## 2023-09-01 DIAGNOSIS — H6991 Unspecified Eustachian tube disorder, right ear: Secondary | ICD-10-CM | POA: Diagnosis not present

## 2023-09-01 MED ORDER — FLUTICASONE PROPIONATE 50 MCG/ACT NA SUSP: 2.0000 | Freq: Every day | NASAL | 0 refills | Status: AC

## 2023-09-01 MED ORDER — DOXYCYCLINE HYCLATE 100 MG PO TABS: 100.0000 mg | ORAL_TABLET | Freq: Two times a day (BID) | ORAL | 0 refills | Status: AC

## 2023-09-01 NOTE — Assessment & Plan Note (Signed)
Will treat with doxycycline 100 mg twice daily.  Patient can rest drink plenty of fluids and Flonase as directed follow-up if no improvement

## 2023-09-01 NOTE — Patient Instructions (Addendum)
Nice to see you today I have sent in 2 medications to the pharmacy  If you want some cough medication I recommend Delsym over the counter If you do not improve let me know   Follow up in tne next 2-3 months for your physical

## 2023-09-01 NOTE — Progress Notes (Signed)
Acute Office Visit  Subjective:     Patient ID: Randy Mcintyre, male    DOB: 03/07/1996, 27 y.o.   MRN: [id number]  Chief Complaint  Patient presents with   Cough    Pt complains of cough and congestion that started over a week ago.  Mucus color green. No chest pain. States that symptoms are on and off. 3 days ago complains of slight sore throat and then only persistent coughing . No fevers. No body aches. Pt does smoke cigarettes      Patient is in today for sick symptoms with a history of tobacco abuse, anxiety   Symptoms started a week ago No sick contacts No covid test  Flu not up to date Covid not up to date States that he has tried mucinex, tussine. States that it helped for brief periods. States that  his symptoms have plateaud out.   Review of Systems  Constitutional:  Negative for chills, fever and malaise/fatigue.  HENT:  Positive for ear pain (popping/crackle) and sinus pain. Negative for sore throat.   Respiratory:  Positive for cough and sputum production (green color). Negative for shortness of breath.   Cardiovascular:  Negative for chest pain.  Gastrointestinal:  Negative for abdominal pain, diarrhea, nausea and vomiting.  Musculoskeletal:  Negative for joint pain and myalgias.  Neurological:  Positive for headaches.        Objective:    BP 110/70   Pulse 73   Temp 98.2 F (36.8 C) (Oral)   Ht 5\' 9"  (1.753 m)   Wt 191 lb (86.6 kg)   SpO2 98%   BMI 28.21 kg/m    Physical Exam Vitals and nursing note reviewed.  Constitutional:      Appearance: Normal appearance.  HENT:     Right Ear: Ear canal and external ear normal.     Left Ear: Tympanic membrane, ear canal and external ear normal.     Nose:     Right Sinus: Frontal sinus tenderness present. No maxillary sinus tenderness.     Left Sinus: Frontal sinus tenderness present. No maxillary sinus tenderness.     Mouth/Throat:     Mouth: Mucous membranes are moist.     Pharynx: Oropharynx is clear.   Cardiovascular:     Rate and Rhythm: Normal rate and regular rhythm.     Heart sounds: Normal heart sounds.  Pulmonary:     Effort: Pulmonary effort is normal.     Breath sounds: Normal breath sounds.  Lymphadenopathy:     Cervical: Cervical adenopathy present.  Neurological:     Mental Status: He is alert.     No results found for any visits on 09/01/23.      Assessment & Plan:   Problem List Items Addressed This Visit       Respiratory   Acute non-recurrent frontal sinusitis - Primary    Will treat with doxycycline 100 mg twice daily.  Patient can rest drink plenty of fluids and Flonase as directed follow-up if no improvement      Relevant Medications   doxycycline (VIBRA-TABS) 100 MG tablet   fluticasone (FLONASE) 50 MCG/ACT nasal spray     Nervous and Auditory   Dysfunction of right eustachian tube    Fluticasone nasal spray 50 mcg per actuation 2 sprays each nostril daily.  Epistaxis precautions reviewed      Relevant Medications   fluticasone (FLONASE) 50 MCG/ACT nasal spray    Meds ordered this encounter  Medications  doxycycline (VIBRA-TABS) 100 MG tablet    Sig: Take 1 tablet (100 mg total) by mouth 2 (two) times daily for 7 days.    Dispense:  14 tablet    Refill:  0    Order Specific Question:   Supervising Provider    Answer:   Milinda Antis MARNE A [[id number]]   fluticasone (FLONASE) 50 MCG/ACT nasal spray    Sig: Place 2 sprays into both nostrils daily.    Dispense:  16 g    Refill:  0    Order Specific Question:   Supervising Provider    Answer:   Roxy Manns A [[id number]]    Return in about 3 months (around 12/02/2023), or if symptoms worsen or fail to improve, for CPE and Labs.  Audria Nine, NP

## 2023-09-01 NOTE — Assessment & Plan Note (Signed)
Fluticasone nasal spray 50 mcg per actuation 2 sprays each nostril daily.  Epistaxis precautions reviewed

## 2023-10-07 ENCOUNTER — Ambulatory Visit: Payer: Medicaid Other | Admitting: Nurse Practitioner

## 2023-10-07 ENCOUNTER — Encounter: Payer: Self-pay | Admitting: Nurse Practitioner

## 2023-10-07 VITALS — BP 120/88 | HR 91 | Temp 98.2°F | Ht 69.0 in | Wt 188.0 lb

## 2023-10-07 DIAGNOSIS — J069 Acute upper respiratory infection, unspecified: Secondary | ICD-10-CM | POA: Insufficient documentation

## 2023-10-07 DIAGNOSIS — R051 Acute cough: Secondary | ICD-10-CM | POA: Diagnosis not present

## 2023-10-07 DIAGNOSIS — J029 Acute pharyngitis, unspecified: Secondary | ICD-10-CM | POA: Diagnosis not present

## 2023-10-07 LAB — POC COVID19 BINAXNOW: SARS Coronavirus 2 Ag: NEGATIVE

## 2023-10-07 LAB — POCT RAPID STREP A (OFFICE): Rapid Strep A Screen: NEGATIVE

## 2023-10-07 LAB — POCT FLU A/B STATUS
Influenza A, POC: NEGATIVE
Influenza B, POC: NEGATIVE

## 2023-10-07 NOTE — Assessment & Plan Note (Signed)
Flu, COVID, strep test negative in office.  Signs and symptoms most consistent with viral upper for infection.  Symptomatic treatment reviewed with patient will follow-up if no improvement in approximately 4 days

## 2023-10-07 NOTE — Progress Notes (Signed)
Acute Office Visit  Subjective:     Patient ID: Randy Mcintyre, male    DOB: 22-Jul-1996, 27 y.o.   MRN: [id number]  Chief Complaint  Patient presents with   Cough    Pt complains of sore throat, cough and congestion that started on christmas eve.     Cough Associated symptoms include headaches and a sore throat. Pertinent negatives include no chills, ear pain, fever, myalgias or shortness of breath.   Patient is in today for sick symptoms with a history of sinusitis, ARF, transaminitis   Symptoms started on 10/04/2023 Covid vaccine: not up to date Flu vaccine: not up to date   No sick contacts that he knows of He was at work at 10/03/2023 and his eye puffed up and gooped up. States that he used eye drops.   States that he has used alka seltzers that hlep if he keeps them in him   Review of Systems  Constitutional:  Positive for malaise/fatigue. Negative for chills and fever.  HENT:  Positive for sore throat. Negative for ear discharge, ear pain and sinus pain.   Respiratory:  Positive for cough and sputum production. Negative for shortness of breath.   Gastrointestinal:  Positive for diarrhea. Negative for abdominal pain, constipation, nausea and vomiting.  Musculoskeletal:  Negative for joint pain and myalgias.  Neurological:  Positive for headaches.        Objective:    BP 120/88   Pulse 91   Temp 98.2 F (36.8 C) (Oral)   Ht 5\' 9"  (1.753 m)   Wt 188 lb (85.3 kg)   SpO2 98%   BMI 27.76 kg/m    Physical Exam Vitals and nursing note reviewed.  Constitutional:      Appearance: Normal appearance.  HENT:     Right Ear: Tympanic membrane, ear canal and external ear normal.     Left Ear: Ear canal and external ear normal.     Ears:     Comments: Clear fluid behind left TM    Nose:     Right Sinus: No maxillary sinus tenderness or frontal sinus tenderness.     Left Sinus: No maxillary sinus tenderness or frontal sinus tenderness.     Mouth/Throat:     Mouth:  Mucous membranes are moist.     Pharynx: Oropharynx is clear.  Cardiovascular:     Rate and Rhythm: Normal rate and regular rhythm.     Heart sounds: Normal heart sounds.  Pulmonary:     Effort: Pulmonary effort is normal.     Breath sounds: Normal breath sounds.  Lymphadenopathy:     Cervical: No cervical adenopathy.  Neurological:     Mental Status: He is alert.     Results for orders placed or performed in visit on 10/07/23  POCT Flu A & B Status  Result Value Ref Range   Influenza A, POC Negative Negative   Influenza B, POC Negative Negative  Rapid Strep A  Result Value Ref Range   Rapid Strep A Screen Negative Negative  POC COVID-19 BinaxNow  Result Value Ref Range   SARS Coronavirus 2 Ag Negative Negative        Assessment & Plan:   Problem List Items Addressed This Visit       Respiratory   Upper respiratory tract infection   Flu, COVID, strep test negative in office.  Signs and symptoms most consistent with viral upper for infection.  Symptomatic treatment reviewed with patient will follow-up if no  improvement in approximately 4 days        Other   Acute cough   Flu and COVID test in office.  Patient continue using over-the-counter cold and flu medications as beneficial      Relevant Orders   POCT Flu A & B Status (Completed)   POC COVID-19 BinaxNow (Completed)   Sore throat - Primary   Flu, COVID, strep test in office.  Patient can use warm salt water gargles along with over-the-counter analgesics as needed for discomforts.      Relevant Orders   POCT Flu A & B Status (Completed)   Rapid Strep A (Completed)   POC COVID-19 BinaxNow (Completed)    No orders of the defined types were placed in this encounter.   Return if symptoms worsen or fail to improve.  Audria Nine, NP

## 2023-10-07 NOTE — Assessment & Plan Note (Signed)
Flu, COVID, strep test in office.  Patient can use warm salt water gargles along with over-the-counter analgesics as needed for discomforts.

## 2023-10-07 NOTE — Patient Instructions (Signed)
Nice to see you today You should start feeling better over the next 4-5 days Let me know if you don't Pick up the flonase You can use warm salt water gargles and over the counter analgesic as needed for the sore throat

## 2023-10-07 NOTE — Assessment & Plan Note (Signed)
Flu and COVID test in office.  Patient continue using over-the-counter cold and flu medications as beneficial

## 2023-10-13 ENCOUNTER — Telehealth: Payer: Self-pay | Admitting: *Deleted

## 2023-10-13 MED ORDER — AZITHROMYCIN 250 MG PO TABS: ORAL_TABLET | ORAL | 0 refills | Status: AC

## 2023-10-13 NOTE — Addendum Note (Signed)
 Addended by: Eden Emms on: 10/13/2023 04:08 PM   Modules accepted: Orders

## 2023-10-13 NOTE — Telephone Encounter (Signed)
 Copied from CRM [id number]. Topic: Clinical - Medication Question >> Oct 13, 2023  3:06 PM Evie B wrote: Reason for CRM: pt called requesting the provider call in some antibiotics for his sore throat , mild cough. Pt states provider advised him if he did not feel better to call   and request antibiotics.

## 2023-10-13 NOTE — Telephone Encounter (Signed)
 Called patient let him know meds have been sent in. Will call if any changes.

## 2023-10-13 NOTE — Telephone Encounter (Signed)
Zpak sent to pharmacy on file.

## 2023-12-02 ENCOUNTER — Encounter: Payer: 59 | Admitting: Nurse Practitioner

## 2023-12-05 ENCOUNTER — Encounter: Payer: Self-pay | Admitting: Nurse Practitioner

## 2024-04-05 ENCOUNTER — Encounter: Payer: Self-pay | Admitting: Emergency Medicine

## 2024-04-05 ENCOUNTER — Ambulatory Visit
Admission: EM | Admit: 2024-04-05 | Discharge: 2024-04-05 | Disposition: A | Discharge status: Home / Self Care | Attending: Physician Assistant | Admitting: Physician Assistant

## 2024-04-05 DIAGNOSIS — T23171A Burn of first degree of right wrist, initial encounter: Secondary | ICD-10-CM

## 2024-04-05 MED ORDER — SILVER SULFADIAZINE 1 % EX CREA
1.0000 | TOPICAL_CREAM | Freq: Every day | CUTANEOUS | 0 refills | Status: AC
Start: 2024-04-05 — End: ?

## 2024-04-05 MED ORDER — SILVER SULFADIAZINE 1 % EX CREA: TOPICAL_CREAM | Freq: Once | CUTANEOUS | Status: AC

## 2024-04-05 NOTE — ED Provider Notes (Signed)
 [facility]    CSN: [id number] Arrival date & time: 04/05/24  1000      History   Chief Complaint Chief Complaint  Patient presents with   Burn    HPI Randy Mcintyre is a 28 y.o. male.   Patient here today for evaluation of burn to right wrist that occurred earlier this morning.  He states he unscrewed his radiator cap too fast and in doing so was burnt by the hot radiator liquid.  He reports he did apply ice to the area which was helpful.  He denies any numbness or tingling.  The history is provided by the patient.  Burn Associated symptoms: no shortness of breath     Past Medical History:  Diagnosis Date   Anxiety    Complication of anesthesia 07/08/2018   pation had a reducation and splitting in ED- they said that it took 3 times the amount it should have.   History of MRSA infection     Patient Active Problem List   Diagnosis Date Noted   Upper respiratory tract infection 10/07/2023   Acute cough 10/07/2023   Sore throat 10/07/2023   Acute non-recurrent frontal sinusitis 09/01/2023   Dysfunction of right eustachian tube 09/01/2023   Eye swelling, left 07/20/2022   Preventative health care 07/20/2022   Overweight 07/20/2022   Hordeolum internum of left upper eyelid 07/20/2022   Substance abuse (HCC) 08/02/2018   Pressure injury of skin 07/19/2018   Acute renal failure (HCC)    Acute respiratory failure with hypoxia (HCC)    Transaminitis    Altered mental status 07/16/2018   Shock circulatory (HCC)     Past Surgical History:  Procedure Laterality Date   OPEN REDUCTION INTERNAL FIXATION (ORIF) FOOT LISFRANC FRACTURE Left 07/26/2018   Procedure: OPEN REDUCTION INTERNAL FIXATION (ORIF) FOOT LISFRANC  and metatarsal FRACTUREs;  Surgeon: Elsa Lonni SAUNDERS, MD;  Location: [facility] OR;  Service: Orthopedics;  Laterality: Left;   ORIF ANKLE FRACTURE Left 04/05/2022   Procedure: OPEN TREATMENT OF LEFT PILON ANKLE FRACTURE;  Surgeon: Elsa Lonni SAUNDERS,  MD;  Location: [facility] OR;  Service: Orthopedics;  Laterality: Left;       Home Medications    Prior to Admission medications   Medication Sig Start Date End Date Taking? Authorizing Provider  silver sulfADIAZINE (SILVADENE) 1 % cream Apply 1 Application topically daily. 04/05/24  Yes Billy Asberry FALCON, PA-C  fluticasone  (FLONASE ) 50 MCG/ACT nasal spray Place 2 sprays into both nostrils daily. Patient not taking: Reported on 10/07/2023 09/01/23   Wendee Lynwood HERO, NP    Family History Family History  Problem Relation Age of Onset   Diabetes Mother    Diabetes Maternal Grandfather    Cancer Paternal Grandmother 81       breast cancer   COPD Paternal Grandfather     Social History Social History   Tobacco Use   Smoking status: Former    Current packs/day: 0.00    Average packs/day: 0.5 packs/day for 7.0 years (3.5 ttl pk-yrs)    Types: Cigarettes    Start date: 2012    Quit date: 2019    Years since quitting: 6.4   Smokeless tobacco: Former    Types: Chew   Tobacco comments:    maybe 10 cigs a week,  Chew- 2 pouches a week   Vaping Use   Vaping status: Former  Substance Use Topics   Alcohol use: Not Currently    Comment: 3 to 4 beers a day  Drug use: Yes    Types: Marijuana    Comment: couple times a week and helps with anxiety     Allergies   Penicillins   Review of Systems Review of Systems  Constitutional:  Negative for chills and fever.  Eyes:  Negative for discharge and redness.  Respiratory:  Negative for shortness of breath.   Skin:  Positive for color change. Negative for wound.  Neurological:  Negative for numbness.     Physical Exam Triage Vital Signs ED Triage Vitals [04/05/24 1041]  Encounter Vitals Group     BP 114/74     Girls Systolic BP Percentile      Girls Diastolic BP Percentile      Boys Systolic BP Percentile      Boys Diastolic BP Percentile      Pulse Rate 80     Resp 16     Temp 97.8 F (36.6 C)     Temp Source Oral     SpO2  96 %     Weight      Height      Head Circumference      Peak Flow      Pain Score 5     Pain Loc      Pain Education      Exclude from Growth Chart    No data found.  Updated Vital Signs BP 114/74 (BP Location: Right Arm)   Pulse 80   Temp 97.8 F (36.6 C) (Oral)   Resp 16   SpO2 96%   Visual Acuity Right Eye Distance:   Left Eye Distance:   Bilateral Distance:    Right Eye Near:   Left Eye Near:    Bilateral Near:     Physical Exam Vitals and nursing note reviewed.  Constitutional:      General: He is not in acute distress.    Appearance: Normal appearance. He is not ill-appearing.  HENT:     Head: Normocephalic and atraumatic.   Eyes:     Conjunctiva/sclera: Conjunctivae normal.    Cardiovascular:     Rate and Rhythm: Normal rate.  Pulmonary:     Effort: Pulmonary effort is normal. No respiratory distress.   Skin:    Comments: See photos   Neurological:     Mental Status: He is alert.   Psychiatric:        Mood and Affect: Mood normal.        Behavior: Behavior normal.        Thought Content: Thought content normal.         UC Treatments / Results  Labs (all labs ordered are listed, but only abnormal results are displayed) Labs Reviewed - No data to display  EKG   Radiology No results found.  Procedures Procedures (including critical care time)  Medications Ordered in UC Medications  silver sulfADIAZINE (SILVADENE) 1 % cream ( Topical Given 04/05/24 1059)    Initial Impression / Assessment and Plan / UC Course  I have reviewed the triage vital signs and the nursing notes.  Pertinent labs & imaging results that were available during my care of the patient were reviewed by me and considered in my medical decision making (see chart for details).    Will treat with silvadene which I advised patient to use the next 3 days. Encouraged follow up with any signs of infection or further concerns. Patient expressed understanding.    Final Clinical Impressions(s) / UC Diagnoses   Final diagnoses:  Burn, wrist, first degree, right, initial encounter   Discharge Instructions   None    ED Prescriptions     Medication Sig Dispense Auth. Provider   silver sulfADIAZINE (SILVADENE) 1 % cream Apply 1 Application topically daily. 50 g Billy Asberry FALCON, PA-C      PDMP not reviewed this encounter.   Billy Asberry FALCON, PA-C 04/05/24 1221

## 2024-04-05 NOTE — ED Triage Notes (Signed)
 Pt here for burn to R wrist that occurred earlier this morning (~7:30) when he unscrewed radiator cap too fast. Applied some ice to the area.

## 2024-11-02 ENCOUNTER — Other Ambulatory Visit: Payer: Self-pay | Admitting: Physician Assistant

## 2024-11-02 ENCOUNTER — Ambulatory Visit
Admission: RE | Admit: 2024-11-02 | Discharge: 2024-11-02 | Disposition: A | Source: Ambulatory Visit | Attending: Physician Assistant | Admitting: Physician Assistant

## 2024-11-02 DIAGNOSIS — R0789 Other chest pain: Secondary | ICD-10-CM
